# Patient Record
Sex: Male | Born: 1950 | Race: White | Hispanic: No | Smoking: Never smoker
Health system: Southern US, Community
[De-identification: ages and names within clinical notes are randomized; demographics above are authoritative.]

## PROBLEM LIST (undated history)

## (undated) DIAGNOSIS — H9190 Unspecified hearing loss, unspecified ear: Secondary | ICD-10-CM

## (undated) DIAGNOSIS — I10 Essential (primary) hypertension: Secondary | ICD-10-CM

## (undated) HISTORY — PX: APPENDECTOMY: SHX54

## (undated) HISTORY — DX: Essential (primary) hypertension: I10

## (undated) HISTORY — PX: DENTAL SURGERY: SHX609

## (undated) HISTORY — PX: NASAL SEPTUM SURGERY: SHX37

## (undated) HISTORY — DX: Unspecified hearing loss, unspecified ear: H91.90

## (undated) HISTORY — PX: CATARACT EXTRACTION, BILATERAL: SHX1313

---

## 1999-07-28 ENCOUNTER — Ambulatory Visit (HOSPITAL_COMMUNITY): Admission: RE | Admit: 1999-07-28 | Discharge: 1999-07-28 | Payer: Self-pay | Admitting: Otolaryngology

## 1999-07-28 ENCOUNTER — Encounter: Payer: Self-pay | Admitting: Otolaryngology

## 2007-09-03 HISTORY — PX: COLONOSCOPY: SHX174

## 2008-04-29 ENCOUNTER — Ambulatory Visit: Payer: Self-pay | Admitting: Internal Medicine

## 2008-05-13 ENCOUNTER — Ambulatory Visit: Payer: Self-pay | Admitting: Internal Medicine

## 2010-06-11 ENCOUNTER — Ambulatory Visit: Payer: Self-pay | Admitting: Cardiovascular Disease

## 2010-06-13 ENCOUNTER — Ambulatory Visit: Payer: Self-pay | Admitting: Cardiovascular Disease

## 2010-06-13 ENCOUNTER — Encounter: Payer: Self-pay | Admitting: Cardiovascular Disease

## 2010-06-13 ENCOUNTER — Ambulatory Visit: Payer: Self-pay

## 2010-06-13 ENCOUNTER — Ambulatory Visit (HOSPITAL_COMMUNITY): Admission: RE | Admit: 2010-06-13 | Discharge: 2010-06-13 | Payer: Self-pay | Admitting: Cardiovascular Disease

## 2010-09-20 ENCOUNTER — Ambulatory Visit: Payer: Self-pay | Admitting: Cardiovascular Disease

## 2017-07-23 ENCOUNTER — Encounter: Payer: Self-pay | Admitting: Neurology

## 2017-10-08 ENCOUNTER — Ambulatory Visit: Payer: 59 | Admitting: Neurology

## 2017-10-08 ENCOUNTER — Encounter: Payer: Self-pay | Admitting: Neurology

## 2017-10-08 ENCOUNTER — Other Ambulatory Visit: Payer: 59

## 2017-10-08 VITALS — BP 166/94 | HR 86 | Ht 73.0 in | Wt 181.0 lb

## 2017-10-08 DIAGNOSIS — R413 Other amnesia: Secondary | ICD-10-CM | POA: Diagnosis not present

## 2017-10-08 NOTE — Progress Notes (Signed)
NEUROLOGY CONSULTATION NOTE  BRYCETON HANTZ MRN: [id number] DOB: 1951/08/11  Referring provider: Dr. Domenick Gong Primary care provider: Dr. Domenick Gong  Reason for consult:  Short term memory loss  Dear Dr Osborne Casco:  Thank you for your kind referral of Seth Gonzalez for consultation of the above symptoms. Although his history is well known to you, please allow me to reiterate it for the purpose of our medical record. Records and images were personally reviewed where available.   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 He states he knows it has gotten worse, but his friend of 30 years "thinks it's Alzheimer's" because she would say something and he would not remember she told him this. He would forget to call her at a certain set time. He feels some of this may relate to his decreased hearing on the right ear, he does notice he picks up less if she talks on that side. He knows that if he has something to do, he has to do it right then, otherwise he forgets. 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 He has always needed to write down notes for things to do, he is able to complete tasks. He continues to work, and has not noticed memory issues affect work. He denies getting lost driving, no missed bills. He is only on aspirin, and takes vitamin B12 and a multivitamin, and does not forget them. He has occasional word-finding difficulties. His paternal uncle had Alzheimer's disease, a maternal aunt had dementia. He denies any history of significant head injuries, no alcohol use.   He has had numbness in his toes and feet that have progressively worsened the past several years. He occasionally feels like his right foot catches. His fingers are occasionally affected. He denies any  significant neck/back pain. He denies any headaches, dizziness, diplopia, dysarthria/dysphagia, bowel/bladder dysfunction, tremors. No recent falls. His sense of smell is decreased.    PAST MEDICAL HISTORY: Past Medical History:  Diagnosis Date  . Hearing loss   . Hypertension     PAST SURGICAL HISTORY: Past Surgical History:  Procedure Laterality Date  . APPENDECTOMY    . DENTAL SURGERY    . NASAL SEPTUM SURGERY      MEDICATIONS: Current Outpatient Medications on File Prior to Visit  Medication Sig Dispense Refill  . aspirin EC 81 MG tablet Take 81 mg by mouth daily.    . Multiple Vitamin (MULTIVITAMIN) tablet Take 1 tablet by mouth daily.    . sildenafil (REVATIO) 20 MG tablet TK 5 TS PO PRF ERECTILE DYSFUNCTION  5   No current facility-administered medications on file prior to visit.     ALLERGIES: No Known Allergies  FAMILY HISTORY: Family History  Problem Relation Age of Onset  . Dementia Maternal Aunt   . Dementia Paternal Uncle     SOCIAL HISTORY: Social History   Socioeconomic History  . Marital status: Divorced    Spouse name: Not on file  . Number of children: Not on file  . Years of education: Not on file  . Highest education level: Not on file  Social Needs  . Financial resource strain: Not on file  . Food insecurity - worry: Not on file  . Food insecurity - inability: Not on file  . Transportation needs - medical:  Not on file  . Transportation needs - non-medical: Not on file  Occupational History  . Not on file  Tobacco Use  . Smoking status: Never Smoker  . Smokeless tobacco: Never Used  Substance and Sexual Activity  . Alcohol use: No    Frequency: Never  . Drug use: No  . Sexual activity: Not on file  Other Topics Concern  . Not on file  Social History Narrative   Pt lives alone in 2 story home   Divorced   No children   Has associates degree   Works as Land.     REVIEW OF SYSTEMS: Constitutional: No fevers,  chills, or sweats, no generalized fatigue, change in appetite Eyes: No visual changes, double vision, eye pain Ear, nose and throat: No hearing loss, ear pain, nasal congestion, sore throat Cardiovascular: No chest pain, palpitations Respiratory:  No shortness of breath at rest or with exertion, wheezes GastrointestinaI: No nausea, vomiting, diarrhea, abdominal pain, fecal incontinence Genitourinary:  No dysuria, urinary retention or frequency Musculoskeletal:  No neck pain, back pain Integumentary: No rash, pruritus, skin lesions Neurological: as above Psychiatric: No depression, insomnia, anxiety Endocrine: No palpitations, fatigue, diaphoresis, mood swings, change in appetite, change in weight, increased thirst Hematologic/Lymphatic:  No anemia, purpura, petechiae. Allergic/Immunologic: no itchy/runny eyes, nasal congestion, recent allergic reactions, rashes  PHYSICAL EXAM: Vitals:   10/08/17 0850  BP: (!) 166/94  Pulse: 86  SpO2: 96%   General: No acute distress Head:  Normocephalic/atraumatic Eyes: Fundoscopic exam shows bilateral sharp discs, no vessel changes, exudates, or hemorrhages Neck: supple, no paraspinal tenderness, full range of motion Back: No paraspinal tenderness Heart: regular rate and rhythm Lungs: Clear to auscultation bilaterally. Vascular: No carotid bruits. Skin/Extremities: No rash, no edema Neurological Exam: Mental status: alert and oriented to person, place, and time, no dysarthria or aphasia, Fund of knowledge is appropriate.  Recent and remote memory are intact.  Attention and concentration are normal.    Able to name objects and repeat phrases.  Montreal Cognitive Assessment  10/08/2017  Visuospatial/ Executive (0/5) 5  Naming (0/3) 3  Attention: Read list of digits (0/2) 2  Attention: Read list of letters (0/1) 1  Attention: Serial 7 subtraction starting at 100 (0/3) 3  Language: Repeat phrase (0/2) 2  Language : Fluency (0/1) 1  Abstraction  (0/2) 2  Delayed Recall (0/5) 4  Orientation (0/6) 6  Total 29   Cranial nerves: CN I: not tested CN II: pupils equal, round and reactive to light, visual fields intact, fundi unremarkable. CN III, IV, VI:  full range of motion, no nystagmus, no ptosis CN V: facial sensation intact CN VII: upper and lower face symmetric CN VIII: hearing intact to finger rub CN IX, X: gag intact, uvula midline CN XI: sternocleidomastoid and trapezius muscles intact CN XII: tongue midline Bulk & Tone: normal, no fasciculations. Motor: 5/5 throughout with no pronator drift. Sensation: intact to light touch, cold, pin on both UE, decreased cold on both LE, tingling to pin on both feet, decreased vibration to left ankle. No extinction to double simultaneous stimulation.  Romberg test negative Deep Tendon Reflexes: brisk +2 on both UE and patella, +1 bilateral ankle jerks, no ankle clonus, negative Hoffman sign Plantar responses: downgoing bilaterally Cerebellar: no incoordination on finger to nose, heel to shin. No dysdiadochokinesia Gait: narrow-based and steady, able to tandem walk adequately. Tremor: none  IMPRESSION: This is a pleasant 67 year old right-handed man with a history of diet-controlled hypertension,  hearing loss on right ear, presenting for worsening short-term memory. His neurological exam is non-focal, there is note of decreased sensation in both feet, reflexes are quite brisk. MOCA score normal 29/30. We discussed different causes of memory loss. Check TSH and B12. MRI brain without contrast will be ordered to assess for underlying structural abnormality and assess vascular load. There is no indication to start cholinesterase inhibitors at this time. He mentions possible attention deficit disorder, we discussed doing Neurocognitive testing to further evaluate cognitive complaints, he would like to hold off for now. We discussed the importance of control of vascular risk factors, physical  exercise, and brain stimulation exercises for brain health. He will follow-up in 6 months and knows to call for any changes.   Thank you for allowing me to participate in the care of this patient. Please do not hesitate to call for any questions or concerns.   Ellouise Newer, M.D.  CC: [name]

## 2017-10-08 NOTE — Patient Instructions (Addendum)
1. Schedule MRI brain without contrast  We have sent a referral to [facility] for your MRI and they will call you directly to schedule your appt. They are located at [place]. If you need to contact them directly please call [phone].   2. Bloodwork for TSH, B12  Your provider has requested that you have labwork completed today. Please go to [facility] ([id number]) on the second floor of this building before leaving the office today. You do not need to check in. If you are not called within 15 minutes please check with the front desk.   3. Follow-up in 6 months, call for any changes  RECOMMENDATIONS FOR ALL PATIENTS WITH MEMORY PROBLEMS: 1. Continue to exercise (Recommend 30 minutes of walking everyday, or 3 hours every week) 2. Increase social interactions - continue going to [place] and enjoy social gatherings with friends and family 3. Eat healthy, avoid fried foods and eat more fruits and vegetables 4. Maintain adequate blood pressure, blood sugar, and blood cholesterol level. Reducing the risk of stroke and cardiovascular disease also helps promoting better memory. 5. Avoid stressful situations. Live a simple life and avoid aggravations. Organize your time and prepare for the next day in anticipation. 6. Sleep well, avoid any interruptions of sleep and avoid any distractions in the bedroom that may interfere with adequate sleep quality 7. Avoid sugar, avoid sweets as there is a strong link between excessive sugar intake, diabetes, and cognitive impairment We discussed the Mediterranean diet, which has been shown to help patients reduce the risk of progressive memory disorders and reduces cardiovascular risk. This includes eating fish, eat fruits and green leafy vegetables, nuts like almonds and hazelnuts, walnuts, and also use olive oil. Avoid fast foods and fried foods as much as possible. Avoid sweets and sugar as sugar use has been linked to worsening of  memory function.

## 2017-10-09 LAB — TSH: TSH: 2.94 m[IU]/L (ref 0.40–4.50)

## 2017-10-09 LAB — VITAMIN B12: VITAMIN B 12: 1061 pg/mL (ref 200–1100)

## 2017-10-16 ENCOUNTER — Telehealth: Payer: Self-pay

## 2017-10-16 NOTE — Telephone Encounter (Signed)
LMOM relaying message below.  

## 2017-10-16 NOTE — Telephone Encounter (Signed)
-----   Message from Cameron Sprang, MD sent at 10/10/2017 12:40 PM EST ----- Pls let him know thyroid and B12 levels are normal. Thanks

## 2017-10-29 ENCOUNTER — Other Ambulatory Visit: Payer: 59

## 2017-10-30 ENCOUNTER — Ambulatory Visit
Admission: RE | Admit: 2017-10-30 | Discharge: 2017-10-30 | Disposition: A | Discharge status: Home / Self Care | Payer: 59 | Source: Ambulatory Visit | Attending: Neurology | Admitting: Neurology

## 2017-10-30 DIAGNOSIS — R413 Other amnesia: Secondary | ICD-10-CM

## 2017-11-06 ENCOUNTER — Telehealth: Payer: Self-pay

## 2017-11-06 NOTE — Telephone Encounter (Signed)
LMOM relaying message below.  

## 2017-11-06 NOTE — Telephone Encounter (Signed)
-----   Message from Cameron Sprang, MD sent at 11/03/2017 12:00 PM EST ----- Pls let him know MRI brain did not show any evidence of tumor, stroke, or bleed. It showed age-related changes. Thanks

## 2017-11-17 ENCOUNTER — Telehealth: Payer: Self-pay | Admitting: Neurology

## 2017-11-17 NOTE — Telephone Encounter (Signed)
Patient wants to be referred to somewhere for he to be tested for dyslexia

## 2017-11-18 ENCOUNTER — Other Ambulatory Visit: Payer: Self-pay

## 2017-11-18 DIAGNOSIS — R4789 Other speech disturbances: Secondary | ICD-10-CM

## 2017-11-18 NOTE — Telephone Encounter (Signed)
Speech therapy can do this, I think. If he agrees, pls send referral to speech therapy for word-finding difficulties, patient concerned about dyslexia. Thanks

## 2017-11-18 NOTE — Telephone Encounter (Signed)
Orders placed.  Spoke with pt making him aware of referral.

## 2017-11-18 NOTE — Telephone Encounter (Signed)
Will send - just not sure where to

## 2018-01-15 ENCOUNTER — Ambulatory Visit: Payer: 59 | Attending: Neurology | Admitting: Speech Pathology

## 2018-01-15 ENCOUNTER — Other Ambulatory Visit: Payer: Self-pay

## 2018-01-15 DIAGNOSIS — R4701 Aphasia: Secondary | ICD-10-CM | POA: Diagnosis present

## 2018-01-15 DIAGNOSIS — R41841 Cognitive communication deficit: Secondary | ICD-10-CM | POA: Diagnosis present

## 2018-01-15 NOTE — Therapy (Signed)
[facility] [address] [place], [place], [address] Phone: [phone]   Fax:  [phone]  Speech Language Pathology Evaluation  Patient Details  Name: Seth Gonzalez MRN: [id number] Date of Birth: August 26, 1951 Referring Provider: Dr. Delice Lesch   Encounter Date: 01/15/2018  End of Session - 01/15/18 1042    Visit Number  1    Number of Visits  1    Date for SLP Re-Evaluation  01/15/18    SLP Start Time  0850    SLP Stop Time   0930    SLP Time Calculation (min)  40 min    Activity Tolerance  Patient tolerated treatment well       Past Medical History:  Diagnosis Date  . Hearing loss   . Hypertension     Past Surgical History:  Procedure Laterality Date  . APPENDECTOMY    . DENTAL SURGERY    . NASAL SEPTUM SURGERY      There were no vitals filed for this visit.  Subjective Assessment - 01/15/18 0853    Subjective  "She's on a kick that I have dyslexia" (pt reports friend has been concerned he has trouble with words)    Currently in Pain?  No/denies         SLP Evaluation [facility] - 01/15/18 0071      SLP Visit Information   SLP Received On  01/15/18    Referring Provider  Dr. Delice Lesch    Onset Date  11/18/17    Medical Diagnosis  word-finding difficulties      General Information   HPI  67 year old right-handed man with a history of diet-controlled hypertension, hearing loss of right ear, referred by Dr. Delice Lesch for worsening short-term memory. Per MD notes neurological exam is non-focal, MOCA score normal 29/30. MRI 10/30/17: "No acute intracranial abnormality. Normal cerebral volume. Scattered cerebral white matter signal changes are nonspecific and only mildly advanced for age."    Behavioral/Cognition  alert, cooperative    Mobility Status  ambulated to session      Balance Screen   Has the patient fallen in the past 6 months  No    Has the patient had a decrease in activity level because of a fear of  falling?   No    Is the patient reluctant to leave their home because of a fear of falling?   No      Prior Functional Status   Cognitive/Linguistic Baseline  Within functional limits    Type of Home  House      Cognition   Overall Cognitive Status  Within Functional Limits for tasks assessed    Attention  Focused;Sustained;Selective;Alternating    Focused Attention  Appears intact    Sustained Attention  Appears intact    Selective Attention  Appears intact    Alternating Attention  Appears intact    Memory  Appears intact    Awareness  Appears intact    Problem Solving  Appears intact    Executive Function  Reasoning;Sequencing;Organizing;Decision Making;Self Monitoring;Initiating;Self Correcting    Reasoning  Appears intact    Sequencing  Appears intact    Organizing  Appears intact    Decision Making  Appears intact    Initiating  Appears intact    Self Monitoring  Appears intact    Self Correcting  Appears intact      Auditory Comprehension   Overall Auditory Comprehension  Appears within functional limits for tasks assessed  Yes/No Questions  Within Functional Limits    Commands  Within Functional Limits    Conversation  Complex    Interfering Components  Hearing      Visual Recognition/Discrimination   Discrimination  Within Function Limits      Reading Comprehension   Reading Status  Not tested      Verbal Expression   Overall Verbal Expression  Appears within functional limits for tasks assessed    Initiation  No impairment    Automatic Speech  Name;Social Response    Level of Generative/Spontaneous Verbalization  Conversation    Repetition  No impairment    Naming  No impairment    Pragmatics  No impairment      Written Expression   Dominant Hand  Right    Written Expression  Not tested      Oral Motor/Sensory Function   Overall Oral Motor/Sensory Function  Appears within functional limits for tasks assessed      Motor Speech   Overall Motor Speech   Appears within functional limits for tasks assessed      Standardized Assessments   Standardized Assessments   Cognitive Linguistic Quick Test;[place] Naming Test-2nd edition    Boston Naming Test-2nd edition   54/60 WNL for age 75-69      Cognitive Linguistic Quick Test (Ages 44-69)   Attention  WNL    Memory  WNL    Executive Function  WNL    Language  WNL    Visuospatial Skills  WNL    Severity Rating Total  20    Composite Severity Rating  16.8                      SLP Education - 01/15/18 1042    Education provided  Yes    Education Details  strategies for attention/memory, cognitive/language activities for home    Person(s) Educated  Patient    Methods  Explanation;Handout    Comprehension  Verbalized understanding           Plan - 01/15/18 1059    Clinical Impression Statement  Seth Gonzalez presents today with cognitive-linguistic skills appearing within normal limits. He reports his close friend of 39 years is concerned he is "dyslexic"; with further questioning pt provides examples including verbally mixing up names (eg. [employer] vs MacGyver, Bush vs Trump) or occasionally mispronouncing names ([place]y vs [place]), and states that his friend feels he "doesn't process a whole sentence." Of note, pt does have hearing loss in his right ear, and reports he has difficulty hearing higher pitched consonant sounds. He states he occasionally has to request repetition in noisier environments. 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 He does report "being more forgetful"; he is using compensatory strategies including writing notes to help with functional recall. No wordfinding difficulties are noted in mod complex-complex conversation. Pt scored 54/60 on BNT, within normal limits (age 59-69 mean 53.3, SD 4.6). Administered CLQT, pt scored in higher range of WNL in attention, memory, executive functions, language,  visuospatial skills and clock drawing. Provided education re: evaluation findings and educated re: communication tips and memory/attention strategies verbally and in handout form. No further skilled ST needs identified at this time; pt encouraged to follow-up with MD and return for re-evaluation if he notes decline in communicative or cognitive function.    Speech Therapy Frequency  One time visit    Treatment/Interventions  Patient/family education  Potential to Achieve Goals  Good    Consulted and Agree with Plan of Care  Patient       Patient will benefit from skilled therapeutic intervention in order to improve the following deficits and impairments:   Cognitive communication deficit  Aphasia    Problem List There are no active problems to display for this patient.  Deneise Lever, [place], CCC-SLP Speech-Language Pathologist  Aliene Altes 01/15/2018, 11:01 AM  [facility] [address] [place], [place], [address] Phone: [phone]   Fax:  [phone]  Name: Seth Gonzalez MRN: [id number] Date of Birth: December 27, 1950

## 2018-01-15 NOTE — Patient Instructions (Signed)
  Cognitive Activities you can do at home:   - Hurley (easy level)  - [place]ola  On your computer, tablet or phone: Furniture conservator/restorer Therapy (free cognitive and language activities for iPhone/iPad) [email or web address] Neuronation App [employer] Game App [employer] IQ Logic [employer] Sort it out (easy) Photo Quiz  - what's the word Mix 2 Words Spot the difference games    Tips for Talking  . Say one thing at a time . Don't  rush - slow down, be patient . Talk face to face . Reduce background noise . Relax - be natural  Describing words  What group does it belong to?  What do I use it for?  Where can I find it?  What does it LOOK like?  What other words go with it?  What is the 1st sound of the word?   Many Ways to Communicate  Describe it Write it Draw it Gesture it Use related words  Strategies for Improving Your Attention and Memory  Use good eye-contact  Give the speaker your undivided attention  Look directly at the speaker  Complete one task at a time  Avoid multitasking  Complete one task before starting a new one  Write a note to yourself if you think of something else that needs to be done  Let others know when you need quiet time and can't be interrupted  Don't answer the phone, texts, or emails while you are working on another task  Put aside distracting thoughts  If you find your mind wandering, refocus your attention on the speaker  Avoid off-topic comments or responses that may divert your attention   If something important comes to mind, let the speaker know and pause to write yourself a note: "Do you mind holding on a minute, I have to write something down."  Put thoughts on hold and focus on salient information  Limit distractions in your  environment  Think about the environment around you  Limit background noise by turning off the TV or music, putting your phone away  Close the door and work in quiet  Use active listening  Actively participate in the conversation to stay focused  Paraphrase what you have heard to include the most important details  Adding some associations may help you remember  Ask questions to clarify certain points  Summarize the speaker's comments periodically  Avoid nodding your head and using "mhm" responses as these are more passive and don't help your attention  Alert the other person/people  It may be helpful to alert your listener to the fact that you may need reminders to keep on track  Tell the speaker in advance that you may need to stop them and have them repeat salient information  If you lose focus, interject and let the person know, "I'm sorry, I lost you, can you tell me again?"  Write down information  Write down pertinent information as it comes up, such as telephone numbers, names of people, addresses, details from appointments and conversations, etc.

## 2018-04-15 ENCOUNTER — Encounter: Payer: Self-pay | Admitting: Neurology

## 2018-04-15 ENCOUNTER — Ambulatory Visit: Payer: 59 | Admitting: Neurology

## 2018-04-15 ENCOUNTER — Other Ambulatory Visit: Payer: Self-pay

## 2018-04-15 VITALS — BP 136/84 | HR 65 | Ht 73.0 in | Wt 176.0 lb

## 2018-04-15 DIAGNOSIS — R413 Other amnesia: Secondary | ICD-10-CM | POA: Diagnosis not present

## 2018-04-15 NOTE — Progress Notes (Signed)
NEUROLOGY FOLLOW UP OFFICE NOTE  TUKKER BYRNS [id number] [birth year]  HISTORY OF PRESENT ILLNESS: I had the pleasure of seeing Starling Christofferson in follow-up in the neurology clinic on 04/15/2018.  The patient was last seen 6 months ago for short-term memory loss. He is alone in the office today.  Records and images were personally reviewed where available.  TSH and B12 normal. I personally reviewed MRI brain without contrast which did not show any acute changes. There was mild diffuse atrophy and mild to moderate chronic microvascular disease. He has not noticed any changes in his memory. His friend has not seen any difference but has not mentioned of significant worsening. His friend wondered about dyslexia, because he would sometimes "misconstrue" what he said. Sometimes he mixes up the last 2 numbers when writing things down, but is aware of it. He denies getting lost driving, no missed bills. He only takes aspirin. He denies any headaches, dizziness, vision changes, no falls. He has numbness in both feet.   History on Initial Assessment 10/08/2017: This is a pleasant 67 year old right-handed [place] with diet-controlled hypertension, right ear hearing loss, presenting for evaluation of short-term memory loss. He states he knows it has gotten worse, but his friend of 30 years "thinks it's Alzheimer's" because she would say something and he would not remember she told him this. He would forget to call her at a certain set time. He feels some of this may relate to his decreased hearing on the right ear, he does notice he picks up less if she talks on that side. He knows that if he has something to do, he has to do it right then, otherwise he forgets. 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 He has always needed to write down notes for things to do, he is able to complete tasks. He continues to work, and has not noticed memory issues affect  work. He denies getting lost driving, no missed bills. He is only on aspirin, and takes vitamin B12 and a multivitamin, and does not forget them. He has occasional word-finding difficulties. His paternal uncle had Alzheimer's disease, a maternal aunt had dementia. He denies any history of significant head injuries, no alcohol use.   He has had numbness in his toes and feet that have progressively worsened the past several years. He occasionally feels like his right foot catches. His fingers are occasionally affected. He denies any significant neck/back pain. He denies any headaches, dizziness, diplopia, dysarthria/dysphagia, bowel/bladder dysfunction, tremors. No recent falls. His sense of smell is decreased.    PAST MEDICAL HISTORY: Past Medical History:  Diagnosis Date  . Hearing loss   . Hypertension     MEDICATIONS: Current Outpatient Medications on File Prior to Visit  Medication Sig Dispense Refill  . aspirin EC 81 MG tablet Take 81 mg by mouth daily.    . Multiple Vitamin (MULTIVITAMIN) tablet Take 1 tablet by mouth daily.    . sildenafil (REVATIO) 20 MG tablet TK 5 TS PO PRF ERECTILE DYSFUNCTION  5   No current facility-administered medications on file prior to visit.     ALLERGIES: No Known Allergies  FAMILY HISTORY: Family History  Problem Relation Age of Onset  . Dementia Maternal Aunt   . Dementia Paternal Uncle     SOCIAL HISTORY: Social History   Socioeconomic History  . Marital status: Divorced    Spouse name: Not on  file  . Number of children: Not on file  . Years of education: Not on file  . Highest education level: Not on file  Occupational History  . Not on file  Social Needs  . Financial resource strain: Not on file  . Food insecurity:    Worry: Not on file    Inability: Not on file  . Transportation needs:    Medical: Not on file    Non-medical: Not on file  Tobacco Use  . Smoking status: Never Smoker  . Smokeless tobacco: Never Used    Substance and Sexual Activity  . Alcohol use: No    Frequency: Never  . Drug use: No  . Sexual activity: Not on file  Lifestyle  . Physical activity:    Days per week: Not on file    Minutes per session: Not on file  . Stress: Not on file  Relationships  . Social connections:    Talks on phone: Not on file    Gets together: Not on file    Attends religious service: Not on file    Active member of club or organization: Not on file    Attends meetings of clubs or organizations: Not on file    Relationship status: Not on file  . Intimate partner violence:    Fear of current or ex partner: Not on file    Emotionally abused: Not on file    Physically abused: Not on file    Forced sexual activity: Not on file  Other Topics Concern  . Not on file  Social History Narrative   Pt lives alone in 2 story home   Divorced   No children   Has associates degree   Works as Land.     REVIEW OF SYSTEMS: Constitutional: No fevers, chills, or sweats, no generalized fatigue, change in appetite Eyes: No visual changes, double vision, eye pain Ear, nose and throat: No hearing loss, ear pain, nasal congestion, sore throat Cardiovascular: No chest pain, palpitations Respiratory:  No shortness of breath at rest or with exertion, wheezes GastrointestinaI: No nausea, vomiting, diarrhea, abdominal pain, fecal incontinence Genitourinary:  No dysuria, urinary retention or frequency Musculoskeletal:  No neck pain, back pain Integumentary: No rash, pruritus, skin lesions Neurological: as above Psychiatric: No depression, insomnia, anxiety Endocrine: No palpitations, fatigue, diaphoresis, mood swings, change in appetite, change in weight, increased thirst Hematologic/Lymphatic:  No anemia, purpura, petechiae. Allergic/Immunologic: no itchy/runny eyes, nasal congestion, recent allergic reactions, rashes  PHYSICAL EXAM: Vitals:   04/15/18 0948  BP: 136/84  Pulse: 65  SpO2: 98%    General: No acute distress Head:  Normocephalic/atraumatic Neck: supple, no paraspinal tenderness, full range of motion Skin/Extremities: No rash, no edema Neurological Exam: alert and oriented to person, place, and time. No aphasia or dysarthria. Fund of knowledge is appropriate.  Recent and remote memory are intact.  Attention and concentration are normal.    Able to name objects and repeat phrases.  Montreal Cognitive Assessment  04/15/2018 10/08/2017  Visuospatial/ Executive (0/5) 5 5  Naming (0/3) 3 3  Attention: Read list of digits (0/2) 2 2  Attention: Read list of letters (0/1) 1 1  Attention: Serial 7 subtraction starting at 100 (0/3) 3 3  Language: Repeat phrase (0/2) 2 2  Language : Fluency (0/1) 1 1  Abstraction (0/2) 2 2  Delayed Recall (0/5) 5 4  Orientation (0/6) 6 6  Total 30 29   Cranial nerves: Pupils equal, round, reactive to light.  Extraocular movements intact with no nystagmus. Visual fields full. Facial sensation intact. No facial asymmetry. Tongue, uvula, palate midline.  Motor: Bulk and tone normal, muscle strength 5/5 throughout with no pronator drift.  Sensation to light touch intact.  No extinction to double simultaneous stimulation.  Deep tendon reflexes +2 throughout, toes downgoing.  Finger to nose testing intact.  Gait narrow-based and steady, able to tandem walk adequately.  Romberg negative.  IMPRESSION: This is a pleasant 67 yo RH man with a history of diet-controlled hypertension, hearing loss on right ear, who presented for worsening short-term memory. His neurological exam is non-focal. MOCA score today normal 30/30. Normal TSH, B12. MRI brain no acute changes. He has not noticed any significant changes in memory. His friend wonders about dyslexia or ADHD, we again discussed doing Neurocognitive testing for further evaluation which he declines today. We again discussed the importance of control of vascular risk factors, physical exercise, and brain  stimulation exercises for brain health. He will follow-up in 1 year and knows to call for any changes  Thank you for allowing me to participate in his care.  Please do not hesitate to call for any questions or concerns.  The duration of this appointment visit was 30 minutes of face-to-face time with the patient.  Greater than 50% of this time was spent in counseling, explanation of diagnosis, planning of further management, and coordination of care.   Ellouise Newer, M.D.   CC: [name]

## 2018-04-15 NOTE — Patient Instructions (Signed)
Looking good! Follow-up in 1 year, call if any changes   RECOMMENDATIONS FOR ALL PATIENTS WITH MEMORY PROBLEMS: 1. Continue to exercise (Recommend 30 minutes of walking everyday, or 3 hours every week) 2. Increase social interactions - continue going to [place] and enjoy social gatherings with friends and family 3. Eat healthy, avoid fried foods and eat more fruits and vegetables 4. Maintain adequate blood pressure, blood sugar, and blood cholesterol level. Reducing the risk of stroke and cardiovascular disease also helps promoting better memory. 5. Avoid stressful situations. Live a simple life and avoid aggravations. Organize your time and prepare for the next day in anticipation. 6. Sleep well, avoid any interruptions of sleep and avoid any distractions in the bedroom that may interfere with adequate sleep quality 7. Avoid sugar, avoid sweets as there is a strong link between excessive sugar intake, diabetes, and cognitive impairment We discussed the Mediterranean diet, which has been shown to help patients reduce the risk of progressive memory disorders and reduces cardiovascular risk. This includes eating fish, eat fruits and green leafy vegetables, nuts like almonds and hazelnuts, walnuts, and also use olive oil. Avoid fast foods and fried foods as much as possible. Avoid sweets and sugar as sugar use has been linked to worsening of memory function.

## 2018-09-07 ENCOUNTER — Encounter: Payer: Self-pay | Admitting: Internal Medicine

## 2019-04-16 ENCOUNTER — Ambulatory Visit: Payer: 59 | Admitting: Neurology

## 2019-09-27 ENCOUNTER — Ambulatory Visit: Payer: Medicare Other | Attending: Internal Medicine

## 2019-09-27 DIAGNOSIS — Z23 Encounter for immunization: Secondary | ICD-10-CM | POA: Insufficient documentation

## 2019-09-27 NOTE — Progress Notes (Signed)
   Covid-19 Vaccination Clinic  Name:  Seth Gonzalez    MRN: [id number] DOB: May 24, 1951  09/27/2019  Mr. Clabough was observed post Covid-19 immunization for 15 minutes without incidence. He was provided with Vaccine Information Sheet and instruction to access the V-Safe system.   Mr. Zakowski was instructed to call 911 with any severe reactions post vaccine: Marland Kitchen Difficulty breathing  . Swelling of your face and throat  . A fast heartbeat  . A bad rash all over your body  . Dizziness and weakness    Immunizations Administered    Name Date Dose VIS Date Route   Pfizer COVID-19 Vaccine 09/27/2019  8:43 AM 0.3 mL 08/13/2019 Intramuscular   Manufacturer: [place]   Lot: [id number]   [place]: [id number]

## 2019-10-18 ENCOUNTER — Ambulatory Visit: Payer: Medicare Other | Attending: Internal Medicine

## 2019-10-18 DIAGNOSIS — Z23 Encounter for immunization: Secondary | ICD-10-CM | POA: Insufficient documentation

## 2019-10-18 NOTE — Progress Notes (Signed)
   Covid-19 Vaccination Clinic  Name:  Seth Gonzalez    MRN: [id number] DOB: Dec 08, 1950  10/18/2019  [name] was observed post Covid-19 immunization for 15 minutes without incidence. He was provided with Vaccine Information Sheet and instruction to access the V-Safe system.   [name] was instructed to call 911 with any severe reactions post vaccine: Marland Kitchen Difficulty breathing  . Swelling of your face and throat  . A fast heartbeat  . A bad rash all over your body  . Dizziness and weakness    Immunizations Administered    Name Date Dose VIS Date Route   Pfizer COVID-19 Vaccine 10/18/2019  9:10 AM 0.3 mL 08/13/2019 Intramuscular   Manufacturer: [place]   Lot: [id number]   [place]: [id number]

## 2019-11-24 ENCOUNTER — Encounter: Payer: Self-pay | Admitting: Internal Medicine

## 2019-12-21 ENCOUNTER — Other Ambulatory Visit: Payer: Self-pay

## 2019-12-21 ENCOUNTER — Ambulatory Visit (AMBULATORY_SURGERY_CENTER): Payer: Self-pay | Admitting: *Deleted

## 2019-12-21 VITALS — Temp 97.1°F | Ht 73.0 in | Wt 185.0 lb

## 2019-12-21 DIAGNOSIS — Z1211 Encounter for screening for malignant neoplasm of colon: Secondary | ICD-10-CM

## 2019-12-21 NOTE — Progress Notes (Signed)
Patient is here in-person for PV. Patient denies any allergies to eggs or soy. Patient denies any problems with anesthesia/sedation. Patient denies any oxygen use at home. Patient denies taking any diet/weight loss medications or blood thinners. Patient is not being treated for MRSA or C-diff. EMMI education assisgned to the patient for the procedure, this was explained and instructions given to patient. Patient is aware of our care-partner policy and 0000000 safety protocol.    Pt completed covid vaccines on 10/18/19.

## 2020-01-04 ENCOUNTER — Encounter: Payer: Medicare Other | Admitting: Internal Medicine

## 2020-01-06 ENCOUNTER — Ambulatory Visit: Payer: Medicare Other | Attending: Internal Medicine

## 2020-01-06 ENCOUNTER — Other Ambulatory Visit: Payer: Self-pay

## 2020-01-06 DIAGNOSIS — Z20822 Contact with and (suspected) exposure to covid-19: Secondary | ICD-10-CM

## 2020-01-07 ENCOUNTER — Encounter: Payer: Medicare Other | Admitting: Internal Medicine

## 2020-01-07 LAB — SARS-COV-2, NAA 2 DAY TAT

## 2020-01-07 LAB — NOVEL CORONAVIRUS, NAA: SARS-CoV-2, NAA: NOT DETECTED

## 2020-02-01 ENCOUNTER — Encounter: Payer: Self-pay | Admitting: Internal Medicine

## 2020-02-01 ENCOUNTER — Ambulatory Visit (AMBULATORY_SURGERY_CENTER): Payer: Medicare Other | Admitting: Internal Medicine

## 2020-02-01 ENCOUNTER — Other Ambulatory Visit: Payer: Self-pay

## 2020-02-01 VITALS — BP 157/85 | HR 55 | Temp 97.3°F | Resp 15 | Ht 73.0 in | Wt 185.0 lb

## 2020-02-01 DIAGNOSIS — Z1211 Encounter for screening for malignant neoplasm of colon: Secondary | ICD-10-CM | POA: Diagnosis not present

## 2020-02-01 MED ORDER — SODIUM CHLORIDE 0.9 % IV SOLN: 500.0000 mL | Freq: Once | INTRAVENOUS | Status: DC

## 2020-02-01 NOTE — Patient Instructions (Addendum)
No polyps or cancer seen today. You do have diverticulosis - thickened muscle rings and pouches in the colon wall. Please read the handout about this condition. Hemorrhoids were also swollen today (common after the preparation and diarrhea).   I appreciate the opportunity to care for you. Gatha Mayer, MD, FACG      YOU HAD AN ENDOSCOPIC PROCEDURE TODAY AT [facility]:   Refer to the procedure report that was given to you for any specific questions about what was found during the examination.  If the procedure report does not answer your questions, please call your gastroenterologist to clarify.  If you requested that your care partner not be given the details of your procedure findings, then the procedure report has been included in a sealed envelope for you to review at your convenience later.  YOU SHOULD EXPECT: Some feelings of bloating in the abdomen. Passage of more gas than usual.  Walking can help get rid of the air that was put into your GI tract during the procedure and reduce the bloating. If you had a lower endoscopy (such as a colonoscopy or flexible sigmoidoscopy) you may notice spotting of blood in your stool or on the toilet paper. If you underwent a bowel prep for your procedure, you may not have a normal bowel movement for a few days.  Please Note:  You might notice some irritation and congestion in your nose or some drainage.  This is from the oxygen used during your procedure.  There is no need for concern and it should clear up in a day or so.  SYMPTOMS TO REPORT IMMEDIATELY:   Following lower endoscopy (colonoscopy or flexible sigmoidoscopy):  Excessive amounts of blood in the stool  Significant tenderness or worsening of abdominal pains  Swelling of the abdomen that is new, acute  Fever of 100F or higher   For urgent or emergent issues, a gastroenterologist can be reached at any hour by calling [phone]. Do not use MyChart messaging for  urgent concerns.    DIET:  We do recommend a small meal at first, but then you may proceed to your regular diet.  Drink plenty of fluids but you should avoid alcoholic beverages for 24 hours.  ACTIVITY:  You should plan to take it easy for the rest of today and you should NOT DRIVE or use heavy machinery until tomorrow (because of the sedation medicines used during the test).    FOLLOW UP: Our staff will call the number listed on your records 48-72 hours following your procedure to check on you and address any questions or concerns that you may have regarding the information given to you following your procedure. If we do not reach you, we will leave a message.  We will attempt to reach you two times.  During this call, we will ask if you have developed any symptoms of COVID 19. If you develop any symptoms (ie: fever, flu-like symptoms, shortness of breath, cough etc.) before then, please call [phone].  If you test positive for Covid 19 in the 2 weeks post procedure, please call and report this information to [place].    If any biopsies were taken you will be contacted by phone or by letter within the next 1-3 weeks.  Please call us at [phone] if you have not heard about the biopsies in 3 weeks.    SIGNATURES/CONFIDENTIALITY: You and/or your care partner have signed paperwork which will be entered into your electronic medical  record.  These signatures attest to the fact that that the information above on your After Visit Summary has been reviewed and is understood.  Full responsibility of the confidentiality of this discharge information lies with you and/or your care-partner.     Handouts were given to you on diverticulosis and hemorrhoids. You may resume your current medications today. Please call if any questions or concerns.

## 2020-02-01 NOTE — Progress Notes (Signed)
Report given to PACU, vss 

## 2020-02-01 NOTE — Progress Notes (Signed)
Pt's states no medical or surgical changes since previsit or office visit. 

## 2020-02-01 NOTE — Op Note (Signed)
[facility] Patient Name: Seth Gonzalez Procedure Date: 02/01/2020 11:30 AM MRN: [id number] Endoscopist: Gatha Mayer , MD Age: 69 Referring MD:  Date of Birth: 03/31/1951 Gender: Male Account #: [id number] Procedure:                Colonoscopy Indications:              Screening for colorectal malignant neoplasm Medicines:                Propofol per Anesthesia, Monitored Anesthesia Care Procedure:                Pre-Anesthesia Assessment:                           - Prior to the procedure, a History and Physical                            was performed, and patient medications and                            allergies were reviewed. The patient's tolerance of                            previous anesthesia was also reviewed. The risks                            and benefits of the procedure and the sedation                            options and risks were discussed with the patient.                            All questions were answered, and informed consent                            was obtained. Prior Anticoagulants: The patient has                            taken no previous anticoagulant or antiplatelet                            agents. ASA Grade Assessment: I - A normal, healthy                            patient. After reviewing the risks and benefits,                            the patient was deemed in satisfactory condition to                            undergo the procedure.                           After obtaining informed consent, the colonoscope  was passed under direct vision. Throughout the                            procedure, the patient's blood pressure, pulse, and                            oxygen saturations were monitored continuously. The                            Colonoscope was introduced through the anus and                            advanced to the the cecum, identified by                            appendiceal orifice  and ileocecal valve. The                            ileocecal valve, appendiceal orifice, and rectum                            were photographed. The quality of the bowel                            preparation was good. The bowel preparation used                            was Miralax via split dose instruction. Scope In: 11:37:56 AM Scope Out: 11:50:44 AM Scope Withdrawal Time: 0 hours 10 minutes 9 seconds  Total Procedure Duration: 0 hours 12 minutes 48 seconds  Findings:                 The perianal and digital rectal examinations were                            normal.                           Many diverticula were found in the sigmoid colon,                            descending colon and transverse colon.                           Internal hemorrhoids were found.                           The exam was otherwise without abnormality on                            direct and retroflexion views. Complications:            No immediate complications. Estimated blood loss:                            None. Estimated Blood Loss:  Estimated blood loss: none. Impression:               - Severe diverticulosis in the sigmoid colon, in                            the descending colon and in the transverse colon.                           - Internal hemorrhoids.                           - The examination was otherwise normal on direct                            and retroflexion views.                           - No specimens collected. Recommendation:           - Resume previous diet.                           - Continue present medications.                           - No repeat colonoscopy due to current age (14                            years or older) and the absence of colonic polyps. Gatha Mayer, MD 02/01/2020 11:59:06 AM This report has been signed electronically.

## 2020-02-03 ENCOUNTER — Telehealth: Payer: Self-pay | Admitting: *Deleted

## 2020-02-03 ENCOUNTER — Telehealth: Payer: Self-pay

## 2020-02-03 NOTE — Telephone Encounter (Signed)
  Follow up Call-  Call back number 02/01/2020  Post procedure Call Back phone  # [phone]  Permission to leave phone message Yes  Some recent data might be hidden     Patient questions:  Do you have a fever, pain , or abdominal swelling? No. Pain Score  0 *  Have you tolerated food without any problems? Yes.    Have you been able to return to your normal activities? Yes.    Do you have any questions about your discharge instructions: Diet   No. Medications  No. Follow up visit  No.  Do you have questions or concerns about your Care? No.  Actions: * If pain score is 4 or above: No action needed, pain <4.  1. Have you developed a fever since your procedure? no  2.   Have you had an respiratory symptoms (SOB or cough) since your procedure? no  3.   Have you tested positive for COVID 19 since your procedure no  4.   Have you had any family members/close contacts diagnosed with the COVID 19 since your procedure?  no   If yes to any of these questions please route to Joylene John, RN and Erenest Rasher, RN

## 2020-02-03 NOTE — Telephone Encounter (Signed)
Message left

## 2020-11-06 DIAGNOSIS — H35373 Puckering of macula, bilateral: Secondary | ICD-10-CM | POA: Diagnosis not present

## 2021-01-10 ENCOUNTER — Ambulatory Visit (HOSPITAL_COMMUNITY)
Admission: RE | Admit: 2021-01-10 | Discharge: 2021-01-10 | Disposition: A | Discharge status: Home / Self Care | Payer: PPO | Source: Ambulatory Visit | Attending: Internal Medicine | Admitting: Internal Medicine

## 2021-01-10 ENCOUNTER — Other Ambulatory Visit: Payer: Self-pay

## 2021-01-10 ENCOUNTER — Other Ambulatory Visit (HOSPITAL_COMMUNITY): Payer: Self-pay | Admitting: Internal Medicine

## 2021-01-10 DIAGNOSIS — R270 Ataxia, unspecified: Secondary | ICD-10-CM | POA: Insufficient documentation

## 2021-01-10 DIAGNOSIS — I639 Cerebral infarction, unspecified: Secondary | ICD-10-CM | POA: Diagnosis not present

## 2021-01-10 DIAGNOSIS — R531 Weakness: Secondary | ICD-10-CM

## 2021-01-10 DIAGNOSIS — R4701 Aphasia: Secondary | ICD-10-CM | POA: Diagnosis not present

## 2021-01-10 DIAGNOSIS — I10 Essential (primary) hypertension: Secondary | ICD-10-CM | POA: Diagnosis not present

## 2021-01-10 DIAGNOSIS — E7800 Pure hypercholesterolemia, unspecified: Secondary | ICD-10-CM | POA: Diagnosis not present

## 2021-01-10 MED ORDER — GADOBUTROL 1 MMOL/ML IV SOLN
8.0000 mL | Freq: Once | INTRAVENOUS | Status: AC | PRN
  Administered 2021-01-10: 8 mL via INTRAVENOUS

## 2021-01-11 ENCOUNTER — Other Ambulatory Visit (HOSPITAL_COMMUNITY): Payer: Self-pay | Admitting: Internal Medicine

## 2021-01-11 DIAGNOSIS — R4701 Aphasia: Secondary | ICD-10-CM

## 2021-01-11 DIAGNOSIS — R531 Weakness: Secondary | ICD-10-CM

## 2021-01-11 DIAGNOSIS — I639 Cerebral infarction, unspecified: Secondary | ICD-10-CM

## 2021-01-11 DIAGNOSIS — R270 Ataxia, unspecified: Secondary | ICD-10-CM

## 2021-01-16 ENCOUNTER — Encounter: Payer: Self-pay | Admitting: Neurology

## 2021-01-16 ENCOUNTER — Ambulatory Visit: Payer: PPO | Admitting: Neurology

## 2021-01-16 ENCOUNTER — Other Ambulatory Visit: Payer: Self-pay

## 2021-01-16 ENCOUNTER — Other Ambulatory Visit (HOSPITAL_COMMUNITY): Payer: Self-pay | Admitting: Internal Medicine

## 2021-01-16 VITALS — BP 195/95 | HR 71 | Ht 73.0 in | Wt 188.2 lb

## 2021-01-16 DIAGNOSIS — I639 Cerebral infarction, unspecified: Secondary | ICD-10-CM

## 2021-01-16 DIAGNOSIS — R531 Weakness: Secondary | ICD-10-CM

## 2021-01-16 MED ORDER — CLOPIDOGREL BISULFATE 75 MG PO TABS: 1.0000 | ORAL_TABLET | Freq: Every day | ORAL | 3 refills | Status: DC

## 2021-01-16 NOTE — Progress Notes (Signed)
Chief Complaint  Patient presents with  . New Patient (Initial Visit)    He is here for evaluation of CVA. Abnormal MRI brain 01/21/2021.       ASSESSMENT AND PLAN  Seth Gonzalez is a 70 y.o. male   Acute stroke on Jan 09, 2021  Small vessel disease  Vascular risk factor of aging, hypertension, hyperlipidemia  Stop aspirin, single agent with Plavix 75 mg along  Echocardiogram, ultrasound of carotid artery are pending  Also encouraging moderate exercise, increase water intake,  Will inform him of ultrasound of carotid artery report, continue to work with his primary care to address vascular risk factor   DIAGNOSTIC DATA (LABS, IMAGING, TESTING) - I reviewed patient records, labs, notes, testing and imaging myself where available.  Laboratory evaluation 2022, normal serum, A1c was 5.5, TSH four-point, CBC, hemoglobin of 16 CMP creatinine of 0.9  MRI brain w/wo on Jan 10 2021 1. Acute/subacute infarct in the left lenticulocapsular region. 2. Moderate chronic microvascular ischemic changes of the white matter, significantly progressed from prior MRI.   HISTORICAL  Seth Gonzalez is a 70 years old male, seen in request by his primary care physician [name], Richard for evaluation of stroke, initial evaluation was on Jan 16, 2021, he was alone at visit  I reviewed and summarized the referring note. PMHX. HTN HLD.  He is a retired Pharmacist, community, lives alone, exercise regularly, also do light weight training regularly, on Jan 09, 2021, he woke up and noticed mild weakness at the right side, when he walks, he leaning towards his right side, bumped things on his right side, when he uses his right hand, he noticed some clumsiness  He was seen by the primary care physician, had MRI of the brain, we personally reviewed the film on Jan 10, 2021, acute stroke involving left lenticular capsular region, moderate supratentorium small vessel disease  He was on aspirin, not added on  Plavix 75 mg,  Laboratory evaluation showed A1c of 5.5, mild elevated blood pressure,  PHYSICAL EXAM:   Vitals:   01/16/21 1012  BP: (!) 195/95  Pulse: 71  Weight: 188 lb 3.2 oz (85.4 kg)  Height: 6\' 1"  (1.854 m)   Not recorded     Body mass index is 24.83 kg/m.  PHYSICAL EXAMNIATION:  Gen: NAD, conversant, well nourised, well groomed                     Cardiovascular: Regular rate rhythm, no peripheral edema, warm, nontender. Eyes: Conjunctivae clear without exudates or hemorrhage Neck: Supple, no carotid bruits. Pulmonary: Clear to auscultation bilaterally   NEUROLOGICAL EXAM:  MENTAL STATUS: Speech:    Speech is normal; fluent and spontaneous with normal comprehension.  Cognition:     Orientation to time, place and person     Normal recent and remote memory     Normal Attention span and concentration     Normal Language, naming, repeating,spontaneous speech     Fund of knowledge   CRANIAL NERVES: CN II: Visual fields are full to confrontation. Pupils are round equal and briskly reactive to light. CN III, IV, VI: extraocular movement are normal. No ptosis. CN V: Facial sensation is intact to light touch CN VII: Face is symmetric with normal eye closure  CN VIII: Hearing is normal to causal conversation. CN IX, X: Phonation is normal. CN XI: Head turning and shoulder shrug are intact  MOTOR: Mild fixation of right arm on rapid rotating  movement, slight right leg drift  REFLEXES: Hyperreflexia of the right upper and lower extremity  SENSORY: Intact to light touch, pinprick and vibratory sensation are intact in fingers and toes.  COORDINATION: There is no trunk or limb dysmetria noted.  GAIT/STANCE: He can get up from seated position arm crossed, decreased right arm swing, slight dragging right leg  REVIEW OF SYSTEMS:  Full 14 system review of systems performed and notable only for as above All other review of systems were  negative.   ALLERGIES: No Known Allergies  HOME MEDICATIONS: Current Outpatient Medications  Medication Sig Dispense Refill  . aspirin EC 81 MG tablet Take 81 mg by mouth daily.    . clopidogrel (PLAVIX) 75 MG tablet Take 1 tablet by mouth daily.    . Multiple Vitamin (MULTIVITAMIN) tablet Take 1 tablet by mouth daily.    . rosuvastatin (CRESTOR) 20 MG tablet Take 20 mg by mouth daily.    . sildenafil (REVATIO) 20 MG tablet TK 5 TS PO PRF ERECTILE DYSFUNCTION  5  . telmisartan (MICARDIS) 40 MG tablet Take 40 mg by mouth daily.     No current facility-administered medications for this visit.    PAST MEDICAL HISTORY: Past Medical History:  Diagnosis Date  . Hearing loss   . Hypertension     PAST SURGICAL HISTORY: Past Surgical History:  Procedure Laterality Date  . APPENDECTOMY    . CATARACT EXTRACTION, BILATERAL    . COLONOSCOPY  2009  . DENTAL SURGERY    . NASAL SEPTUM SURGERY      FAMILY HISTORY: Family History  Problem Relation Age of Onset  . Dementia Maternal Aunt   . Dementia Paternal Uncle   . Colon cancer Neg Hx   . Colon polyps Neg Hx   . Esophageal cancer Neg Hx   . Rectal cancer Neg Hx   . Stomach cancer Neg Hx     SOCIAL HISTORY: Social History   Socioeconomic History  . Marital status: Divorced    Spouse name: Not on file  . Number of children: Not on file  . Years of education: Not on file  . Highest education level: Not on file  Occupational History  . Not on file  Tobacco Use  . Smoking status: Never Smoker  . Smokeless tobacco: Never Used  Vaping Use  . Vaping Use: Never used  Substance and Sexual Activity  . Alcohol use: No  . Drug use: No  . Sexual activity: Not on file  Other Topics Concern  . Not on file  Social History Narrative   Pt lives alone in 2 story home   Divorced   No children   Has associates degree   Works as Land.    Social Determinants of Health   Financial Resource Strain: Not on file   Food Insecurity: Not on file  Transportation Needs: Not on file  Physical Activity: Not on file  Stress: Not on file  Social Connections: Not on file  Intimate Partner Violence: Not on file      Marcial Pacas, M.D. Ph.D.  [facility] [address], [place], [place] [address] Ph: [phone] Fax: [phone]  CC:  Tisovec, Fransico Him, MD [place],  [place] [phone]  Tisovec, Fransico Him, MD

## 2021-01-17 ENCOUNTER — Other Ambulatory Visit: Payer: Self-pay

## 2021-01-17 ENCOUNTER — Ambulatory Visit (HOSPITAL_COMMUNITY)
Admission: RE | Admit: 2021-01-17 | Discharge: 2021-01-17 | Disposition: A | Discharge status: Home / Self Care | Payer: PPO | Source: Ambulatory Visit | Attending: Internal Medicine | Admitting: Internal Medicine

## 2021-01-17 DIAGNOSIS — R531 Weakness: Secondary | ICD-10-CM | POA: Insufficient documentation

## 2021-01-23 ENCOUNTER — Ambulatory Visit (HOSPITAL_COMMUNITY)
Admission: RE | Admit: 2021-01-23 | Discharge: 2021-01-23 | Disposition: A | Discharge status: Home / Self Care | Payer: PPO | Source: Ambulatory Visit | Attending: Internal Medicine | Admitting: Internal Medicine

## 2021-01-23 ENCOUNTER — Other Ambulatory Visit: Payer: Self-pay

## 2021-01-23 DIAGNOSIS — R531 Weakness: Secondary | ICD-10-CM | POA: Diagnosis not present

## 2021-01-23 DIAGNOSIS — R4701 Aphasia: Secondary | ICD-10-CM | POA: Diagnosis not present

## 2021-01-23 DIAGNOSIS — I10 Essential (primary) hypertension: Secondary | ICD-10-CM | POA: Diagnosis not present

## 2021-01-23 DIAGNOSIS — R270 Ataxia, unspecified: Secondary | ICD-10-CM | POA: Insufficient documentation

## 2021-01-23 DIAGNOSIS — I639 Cerebral infarction, unspecified: Secondary | ICD-10-CM | POA: Insufficient documentation

## 2021-01-23 DIAGNOSIS — I6389 Other cerebral infarction: Secondary | ICD-10-CM | POA: Diagnosis not present

## 2021-01-23 LAB — ECHOCARDIOGRAM COMPLETE
Area-P 1/2: 3.34 cm2
S' Lateral: 2.9 cm

## 2021-01-23 NOTE — Progress Notes (Signed)
  Echocardiogram 2D Echocardiogram has been performed.  Jennette Dubin 01/23/2021, 10:56 AM

## 2021-05-24 IMAGING — MR MR HEAD WO/W CM
14 of 16 series · 40 of 48 positions shown · IV contrast (gadavist)
Comparison: MRI of the brain October 30, 2017.

CLINICAL DATA: Ataxia, weakness.  Stroke

EXAM:
MRI HEAD WITHOUT AND WITH CONTRAST
TECHNIQUE: Multiplanar, multiecho pulse sequences of the brain and surrounding
structures were obtained without and with intravenous contrast.
CONTRAST:  8mL GADAVIST GADOBUTROL 1 MMOL/ML IV SOLN

[Series 5: DWI · axial · 3.0mm · 0.92mm/px · z∈[-85,+72]mm · 6 of 108 slices shown (1 of 4)]
[im 1/108]
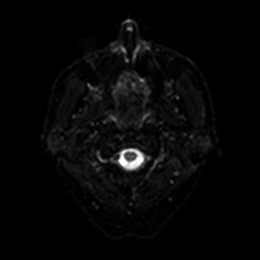
[im 22/108]
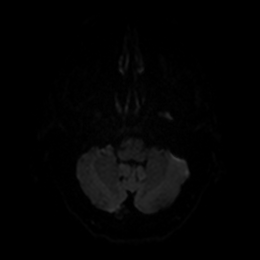
[im 43/108]
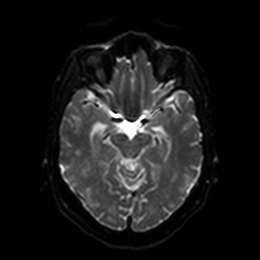
[im 65/108]
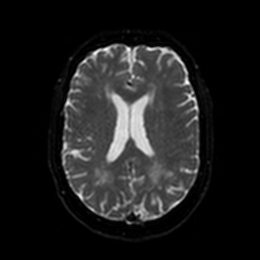
[im 86/108]
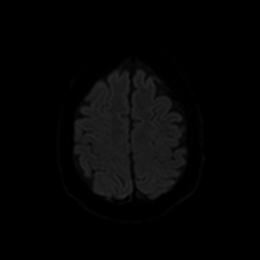
[im 108/108]
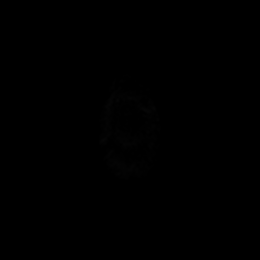

[Series 6: DWI · axial · 3.0mm · 0.92mm/px · z∈[-85,+72]mm · 2 of 54 slices shown (2 of 4)]
[im 1/54]
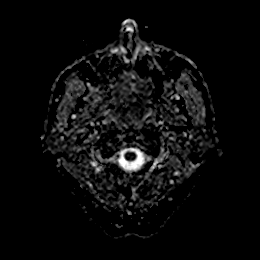
[im 54/54]
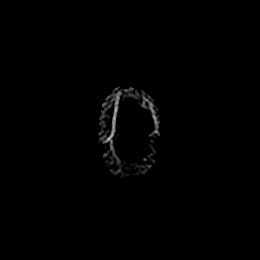

[Series 7: DWI · coronal · 4.0mm · 0.88mm/px · 5 of 80 slices shown (3 of 4)]
[im 1/80]
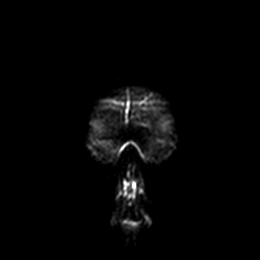
[im 20/80]
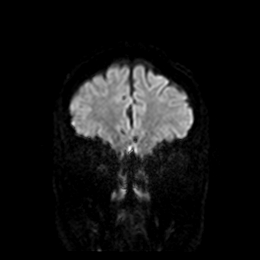
[im 40/80]
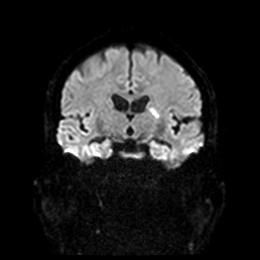
[im 60/80]
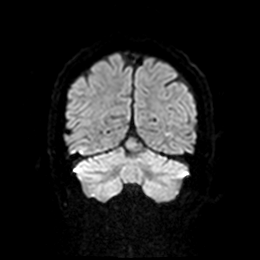
[im 80/80]
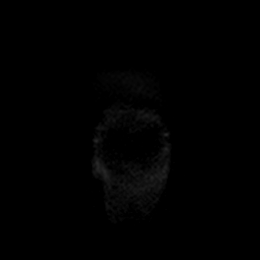

[Series 8: DWI · coronal · 4.0mm · 0.88mm/px · 2 of 40 slices shown (4 of 4)]
[im 1/40]
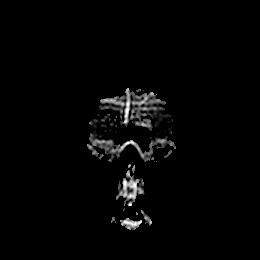
[im 40/40]
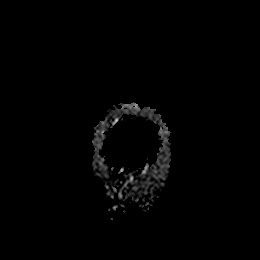

[Series 9: T1 · sagittal · 5.0mm · 0.75mm/px · 1 of 23 slices shown]
[im 1/23]
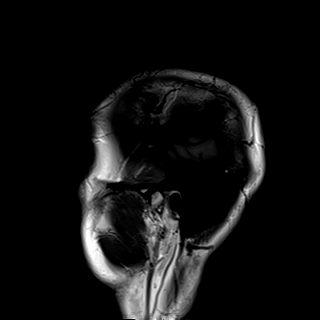

[Series 10: T2 · axial · 5.0mm · 0.72mm/px · z∈[-96,+76]mm · 2 of 30 slices shown]
[im 1/30]
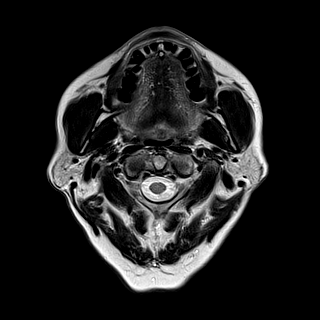
[im 30/30]
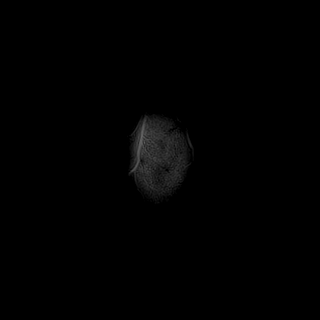

[Series 11: FLAIR · axial · 5.0mm · 0.45mm/px · z∈[-95,+77]mm · 2 of 30 slices shown]
[im 1/30]
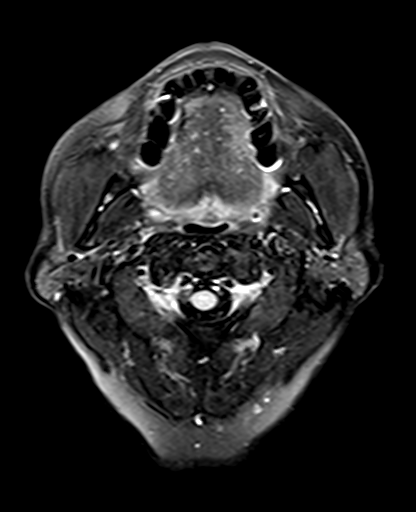
[im 30/30]
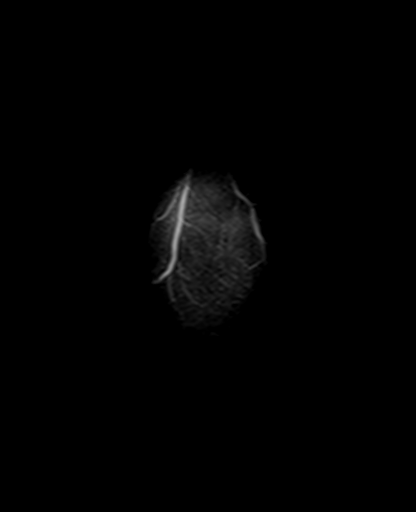

[Series 12: mag_images · axial · 3.0mm · 0.90mm/px · z∈[-89,+86]mm · 4 of 60 slices shown]
[im 1/60]
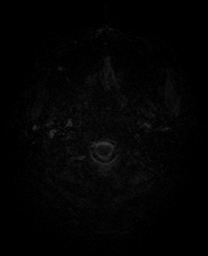
[im 20/60]
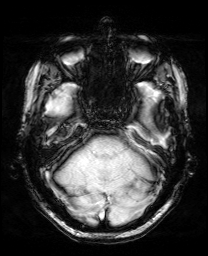
[im 40/60]
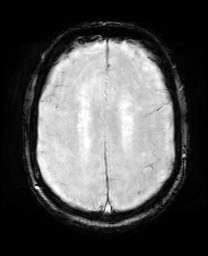
[im 60/60]
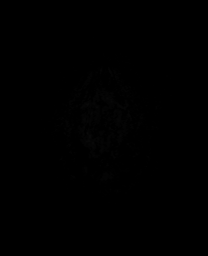

[Series 13: pha_images · axial · 3.0mm · 0.90mm/px · z∈[-89,+83]mm · 4 of 59 slices shown]
[im 1/59]
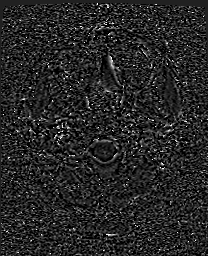
[im 20/59]
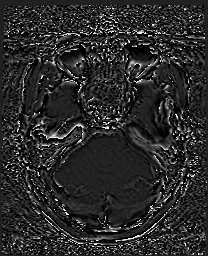
[im 39/59]
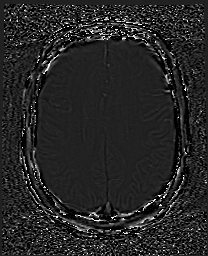
[im 59/59]
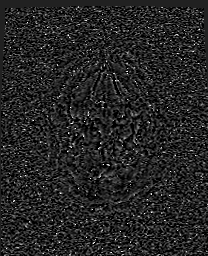

[Series 14: swi_images · axial · 3.0mm · 0.90mm/px · z∈[-89,+86]mm · 4 of 60 slices shown]
[im 1/60]
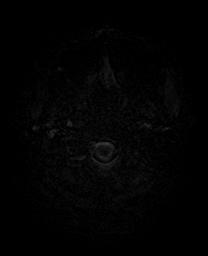
[im 20/60]
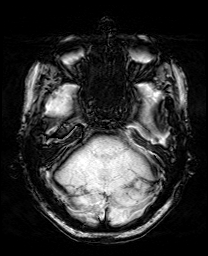
[im 40/60]
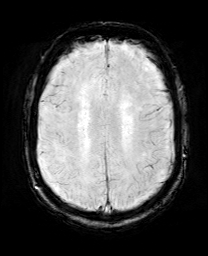
[im 60/60]
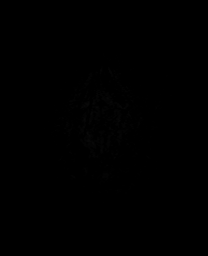

[Series 15: mip_images(sw) · axial · 24.0mm · 0.90mm/px · z∈[-79,+75]mm · 3 of 53 slices shown]
[im 1/53]
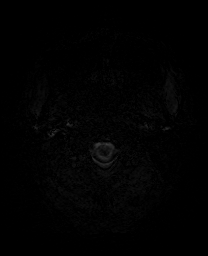
[im 27/53]
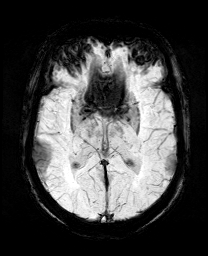
[im 53/53]
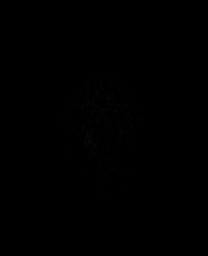

[Series 17: T2 post-contrast · coronal · 5.0mm · 0.72mm/px · 2 of 28 slices shown]
[im 1/28]
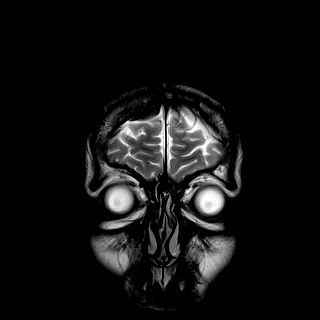
[im 28/28]
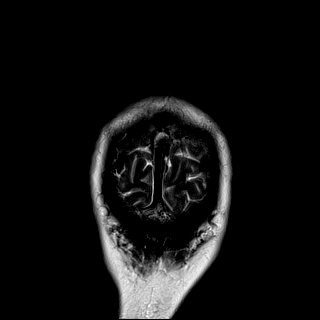

[Series 19: T1 post-contrast · coronal · 5.0mm · 0.34mm/px · 2 of 28 slices shown (1 of 2)]
[im 1/28]
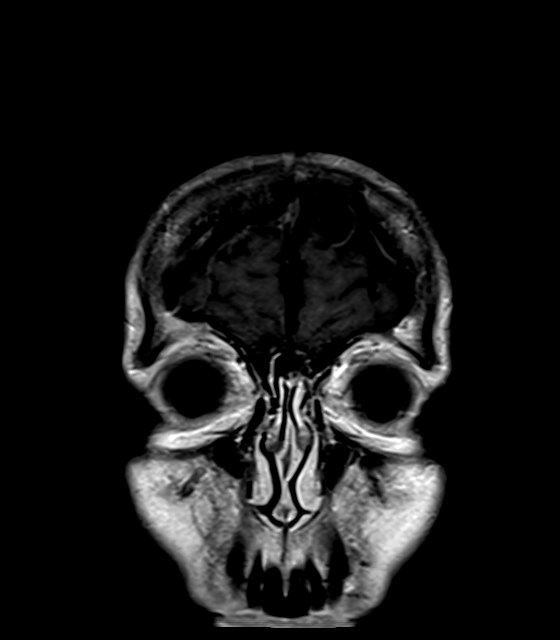
[im 28/28]
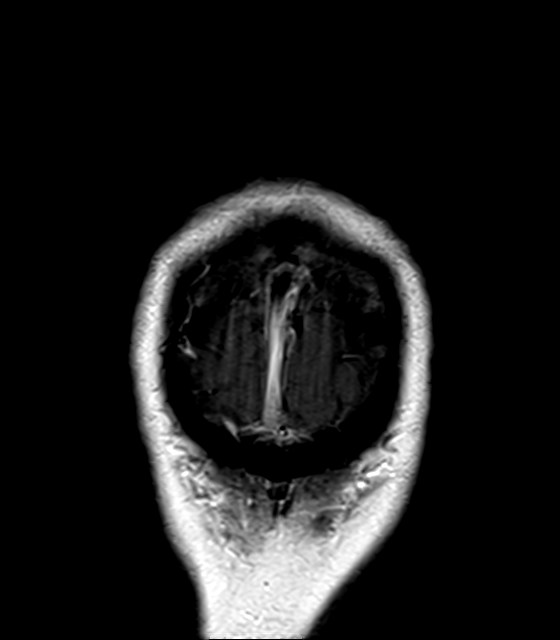

[Series 20: T1 post-contrast · sagittal · 5.0mm · 0.72mm/px · 1 of 23 slices shown (2 of 2)]
[im 1/23]
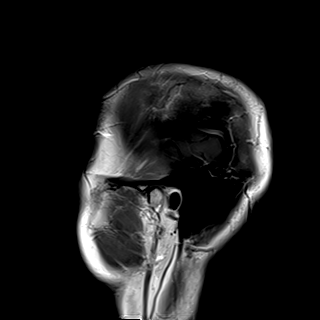

[40 of 48 positions shown; findings below may reference images not displayed]

FINDINGS: Brain: Small area of restricted diffusion is seen in the left
lenticulocapsular region extending to the corona radiata, consistent
with recent infarct. Scattered foci of T2 hyperintensity are seen
within the white matter of the cerebral hemispheres, nonspecific
significantly, progressed from prior MRI. No focus of abnormal
contrast enhancement.

Vascular: Normal flow voids.

Skull and upper cervical spine: Normal marrow signal.

Sinuses/Orbits: Negative.

Other: None.
IMPRESSION: 1. Acute/subacute infarct in the left lenticulocapsular region.
2. Moderate chronic microvascular ischemic changes of the white
matter, significantly progressed from prior MRI.

These results will be called to the ordering clinician or
representative by the Radiologist Assistant, and communication
documented in the PACS or [REDACTED].

## 2021-06-28 DIAGNOSIS — I10 Essential (primary) hypertension: Secondary | ICD-10-CM | POA: Diagnosis not present

## 2021-06-28 DIAGNOSIS — E7800 Pure hypercholesterolemia, unspecified: Secondary | ICD-10-CM | POA: Diagnosis not present

## 2021-07-05 DIAGNOSIS — Z23 Encounter for immunization: Secondary | ICD-10-CM | POA: Diagnosis not present

## 2021-07-05 DIAGNOSIS — G629 Polyneuropathy, unspecified: Secondary | ICD-10-CM | POA: Diagnosis not present

## 2021-07-05 DIAGNOSIS — Z125 Encounter for screening for malignant neoplasm of prostate: Secondary | ICD-10-CM | POA: Diagnosis not present

## 2021-07-05 DIAGNOSIS — Z8673 Personal history of transient ischemic attack (TIA), and cerebral infarction without residual deficits: Secondary | ICD-10-CM | POA: Diagnosis not present

## 2021-07-05 DIAGNOSIS — F900 Attention-deficit hyperactivity disorder, predominantly inattentive type: Secondary | ICD-10-CM | POA: Diagnosis not present

## 2021-07-05 DIAGNOSIS — Z1331 Encounter for screening for depression: Secondary | ICD-10-CM | POA: Diagnosis not present

## 2021-07-05 DIAGNOSIS — E7800 Pure hypercholesterolemia, unspecified: Secondary | ICD-10-CM | POA: Diagnosis not present

## 2021-07-05 DIAGNOSIS — Z0000 Encounter for general adult medical examination without abnormal findings: Secondary | ICD-10-CM | POA: Diagnosis not present

## 2021-07-05 DIAGNOSIS — R270 Ataxia, unspecified: Secondary | ICD-10-CM | POA: Diagnosis not present

## 2021-07-05 DIAGNOSIS — R82998 Other abnormal findings in urine: Secondary | ICD-10-CM | POA: Diagnosis not present

## 2021-07-05 DIAGNOSIS — I10 Essential (primary) hypertension: Secondary | ICD-10-CM | POA: Diagnosis not present

## 2021-07-05 DIAGNOSIS — I679 Cerebrovascular disease, unspecified: Secondary | ICD-10-CM | POA: Diagnosis not present

## 2021-07-05 DIAGNOSIS — N401 Enlarged prostate with lower urinary tract symptoms: Secondary | ICD-10-CM | POA: Diagnosis not present

## 2021-07-05 DIAGNOSIS — Z1389 Encounter for screening for other disorder: Secondary | ICD-10-CM | POA: Diagnosis not present

## 2021-11-09 DIAGNOSIS — H35373 Puckering of macula, bilateral: Secondary | ICD-10-CM | POA: Diagnosis not present

## 2022-01-08 DIAGNOSIS — G629 Polyneuropathy, unspecified: Secondary | ICD-10-CM | POA: Diagnosis not present

## 2022-01-08 DIAGNOSIS — Z1331 Encounter for screening for depression: Secondary | ICD-10-CM | POA: Diagnosis not present

## 2022-01-08 DIAGNOSIS — D126 Benign neoplasm of colon, unspecified: Secondary | ICD-10-CM | POA: Diagnosis not present

## 2022-01-08 DIAGNOSIS — N529 Male erectile dysfunction, unspecified: Secondary | ICD-10-CM | POA: Diagnosis not present

## 2022-01-08 DIAGNOSIS — Z8673 Personal history of transient ischemic attack (TIA), and cerebral infarction without residual deficits: Secondary | ICD-10-CM | POA: Diagnosis not present

## 2022-01-08 DIAGNOSIS — E7800 Pure hypercholesterolemia, unspecified: Secondary | ICD-10-CM | POA: Diagnosis not present

## 2022-01-08 DIAGNOSIS — F900 Attention-deficit hyperactivity disorder, predominantly inattentive type: Secondary | ICD-10-CM | POA: Diagnosis not present

## 2022-01-08 DIAGNOSIS — R4701 Aphasia: Secondary | ICD-10-CM | POA: Diagnosis not present

## 2022-01-08 DIAGNOSIS — R270 Ataxia, unspecified: Secondary | ICD-10-CM | POA: Diagnosis not present

## 2022-01-08 DIAGNOSIS — N401 Enlarged prostate with lower urinary tract symptoms: Secondary | ICD-10-CM | POA: Diagnosis not present

## 2022-01-08 DIAGNOSIS — I679 Cerebrovascular disease, unspecified: Secondary | ICD-10-CM | POA: Diagnosis not present

## 2022-01-08 DIAGNOSIS — I10 Essential (primary) hypertension: Secondary | ICD-10-CM | POA: Diagnosis not present

## 2022-01-22 DIAGNOSIS — I10 Essential (primary) hypertension: Secondary | ICD-10-CM | POA: Diagnosis not present

## 2022-01-22 DIAGNOSIS — I679 Cerebrovascular disease, unspecified: Secondary | ICD-10-CM | POA: Diagnosis not present

## 2022-01-22 DIAGNOSIS — N401 Enlarged prostate with lower urinary tract symptoms: Secondary | ICD-10-CM | POA: Diagnosis not present

## 2022-01-24 ENCOUNTER — Other Ambulatory Visit: Payer: Self-pay | Admitting: Neurology

## 2022-02-19 DIAGNOSIS — I679 Cerebrovascular disease, unspecified: Secondary | ICD-10-CM | POA: Diagnosis not present

## 2022-02-19 DIAGNOSIS — I10 Essential (primary) hypertension: Secondary | ICD-10-CM | POA: Diagnosis not present

## 2022-05-09 DIAGNOSIS — H35373 Puckering of macula, bilateral: Secondary | ICD-10-CM | POA: Diagnosis not present

## 2022-07-10 DIAGNOSIS — Z125 Encounter for screening for malignant neoplasm of prostate: Secondary | ICD-10-CM | POA: Diagnosis not present

## 2022-07-10 DIAGNOSIS — E7800 Pure hypercholesterolemia, unspecified: Secondary | ICD-10-CM | POA: Diagnosis not present

## 2022-07-10 DIAGNOSIS — R7989 Other specified abnormal findings of blood chemistry: Secondary | ICD-10-CM | POA: Diagnosis not present

## 2022-07-10 DIAGNOSIS — I10 Essential (primary) hypertension: Secondary | ICD-10-CM | POA: Diagnosis not present

## 2022-07-17 DIAGNOSIS — Z23 Encounter for immunization: Secondary | ICD-10-CM | POA: Diagnosis not present

## 2022-07-17 DIAGNOSIS — R4701 Aphasia: Secondary | ICD-10-CM | POA: Diagnosis not present

## 2022-07-17 DIAGNOSIS — Z1331 Encounter for screening for depression: Secondary | ICD-10-CM | POA: Diagnosis not present

## 2022-07-17 DIAGNOSIS — F900 Attention-deficit hyperactivity disorder, predominantly inattentive type: Secondary | ICD-10-CM | POA: Diagnosis not present

## 2022-07-17 DIAGNOSIS — Z0000 Encounter for general adult medical examination without abnormal findings: Secondary | ICD-10-CM | POA: Diagnosis not present

## 2022-07-17 DIAGNOSIS — E7800 Pure hypercholesterolemia, unspecified: Secondary | ICD-10-CM | POA: Diagnosis not present

## 2022-07-17 DIAGNOSIS — Z8673 Personal history of transient ischemic attack (TIA), and cerebral infarction without residual deficits: Secondary | ICD-10-CM | POA: Diagnosis not present

## 2022-07-17 DIAGNOSIS — I679 Cerebrovascular disease, unspecified: Secondary | ICD-10-CM | POA: Diagnosis not present

## 2022-07-17 DIAGNOSIS — G629 Polyneuropathy, unspecified: Secondary | ICD-10-CM | POA: Diagnosis not present

## 2022-07-17 DIAGNOSIS — R82998 Other abnormal findings in urine: Secondary | ICD-10-CM | POA: Diagnosis not present

## 2022-07-17 DIAGNOSIS — I10 Essential (primary) hypertension: Secondary | ICD-10-CM | POA: Diagnosis not present

## 2022-07-17 DIAGNOSIS — Z1339 Encounter for screening examination for other mental health and behavioral disorders: Secondary | ICD-10-CM | POA: Diagnosis not present

## 2022-07-17 DIAGNOSIS — Z1212 Encounter for screening for malignant neoplasm of rectum: Secondary | ICD-10-CM | POA: Diagnosis not present

## 2022-07-17 DIAGNOSIS — R270 Ataxia, unspecified: Secondary | ICD-10-CM | POA: Diagnosis not present

## 2022-11-11 DIAGNOSIS — Z961 Presence of intraocular lens: Secondary | ICD-10-CM | POA: Diagnosis not present

## 2022-11-11 DIAGNOSIS — H35373 Puckering of macula, bilateral: Secondary | ICD-10-CM | POA: Diagnosis not present

## 2023-01-21 DIAGNOSIS — I10 Essential (primary) hypertension: Secondary | ICD-10-CM | POA: Diagnosis not present

## 2023-01-21 DIAGNOSIS — E7800 Pure hypercholesterolemia, unspecified: Secondary | ICD-10-CM | POA: Diagnosis not present

## 2023-01-21 DIAGNOSIS — N401 Enlarged prostate with lower urinary tract symptoms: Secondary | ICD-10-CM | POA: Diagnosis not present

## 2023-01-21 DIAGNOSIS — F900 Attention-deficit hyperactivity disorder, predominantly inattentive type: Secondary | ICD-10-CM | POA: Diagnosis not present

## 2023-01-21 DIAGNOSIS — R4701 Aphasia: Secondary | ICD-10-CM | POA: Diagnosis not present

## 2023-01-21 DIAGNOSIS — I679 Cerebrovascular disease, unspecified: Secondary | ICD-10-CM | POA: Diagnosis not present

## 2023-01-21 DIAGNOSIS — R270 Ataxia, unspecified: Secondary | ICD-10-CM | POA: Diagnosis not present

## 2023-01-21 DIAGNOSIS — N529 Male erectile dysfunction, unspecified: Secondary | ICD-10-CM | POA: Diagnosis not present

## 2023-01-21 DIAGNOSIS — Z8673 Personal history of transient ischemic attack (TIA), and cerebral infarction without residual deficits: Secondary | ICD-10-CM | POA: Diagnosis not present

## 2023-01-21 DIAGNOSIS — G629 Polyneuropathy, unspecified: Secondary | ICD-10-CM | POA: Diagnosis not present

## 2023-07-15 DIAGNOSIS — N401 Enlarged prostate with lower urinary tract symptoms: Secondary | ICD-10-CM | POA: Diagnosis not present

## 2023-07-15 DIAGNOSIS — R7989 Other specified abnormal findings of blood chemistry: Secondary | ICD-10-CM | POA: Diagnosis not present

## 2023-07-15 DIAGNOSIS — I10 Essential (primary) hypertension: Secondary | ICD-10-CM | POA: Diagnosis not present

## 2023-07-15 DIAGNOSIS — E7800 Pure hypercholesterolemia, unspecified: Secondary | ICD-10-CM | POA: Diagnosis not present

## 2023-07-22 DIAGNOSIS — Z1331 Encounter for screening for depression: Secondary | ICD-10-CM | POA: Diagnosis not present

## 2023-07-22 DIAGNOSIS — Z1339 Encounter for screening examination for other mental health and behavioral disorders: Secondary | ICD-10-CM | POA: Diagnosis not present

## 2023-07-22 DIAGNOSIS — F900 Attention-deficit hyperactivity disorder, predominantly inattentive type: Secondary | ICD-10-CM | POA: Diagnosis not present

## 2023-07-22 DIAGNOSIS — E7800 Pure hypercholesterolemia, unspecified: Secondary | ICD-10-CM | POA: Diagnosis not present

## 2023-07-22 DIAGNOSIS — I679 Cerebrovascular disease, unspecified: Secondary | ICD-10-CM | POA: Diagnosis not present

## 2023-07-22 DIAGNOSIS — N401 Enlarged prostate with lower urinary tract symptoms: Secondary | ICD-10-CM | POA: Diagnosis not present

## 2023-07-22 DIAGNOSIS — Z1212 Encounter for screening for malignant neoplasm of rectum: Secondary | ICD-10-CM | POA: Diagnosis not present

## 2023-07-22 DIAGNOSIS — Z0000 Encounter for general adult medical examination without abnormal findings: Secondary | ICD-10-CM | POA: Diagnosis not present

## 2023-07-22 DIAGNOSIS — G629 Polyneuropathy, unspecified: Secondary | ICD-10-CM | POA: Diagnosis not present

## 2023-07-22 DIAGNOSIS — Z23 Encounter for immunization: Secondary | ICD-10-CM | POA: Diagnosis not present

## 2023-07-22 DIAGNOSIS — I10 Essential (primary) hypertension: Secondary | ICD-10-CM | POA: Diagnosis not present

## 2023-07-22 DIAGNOSIS — Z8673 Personal history of transient ischemic attack (TIA), and cerebral infarction without residual deficits: Secondary | ICD-10-CM | POA: Diagnosis not present

## 2023-07-22 DIAGNOSIS — R4701 Aphasia: Secondary | ICD-10-CM | POA: Diagnosis not present

## 2023-11-11 DIAGNOSIS — H53461 Homonymous bilateral field defects, right side: Secondary | ICD-10-CM | POA: Diagnosis not present

## 2023-11-12 ENCOUNTER — Inpatient Hospital Stay (HOSPITAL_COMMUNITY)
Admission: EM | Admit: 2023-11-12 | Discharge: 2023-11-14 | DRG: 064 | Disposition: A | Discharge status: Home Health | Attending: Neurology | Admitting: Neurology

## 2023-11-12 ENCOUNTER — Other Ambulatory Visit (HOSPITAL_COMMUNITY): Payer: Self-pay | Admitting: Registered Nurse

## 2023-11-12 ENCOUNTER — Inpatient Hospital Stay (HOSPITAL_COMMUNITY)

## 2023-11-12 ENCOUNTER — Encounter (HOSPITAL_COMMUNITY): Payer: Self-pay

## 2023-11-12 ENCOUNTER — Other Ambulatory Visit: Payer: Self-pay

## 2023-11-12 ENCOUNTER — Ambulatory Visit (HOSPITAL_COMMUNITY)
Admission: RE | Admit: 2023-11-12 | Discharge: 2023-11-12 | Disposition: A | Discharge status: Home / Self Care | Source: Ambulatory Visit | Attending: Registered Nurse

## 2023-11-12 DIAGNOSIS — R29702 NIHSS score 2: Secondary | ICD-10-CM | POA: Diagnosis present

## 2023-11-12 DIAGNOSIS — G936 Cerebral edema: Secondary | ICD-10-CM | POA: Diagnosis present

## 2023-11-12 DIAGNOSIS — R29703 NIHSS score 3: Secondary | ICD-10-CM | POA: Diagnosis not present

## 2023-11-12 DIAGNOSIS — R519 Headache, unspecified: Secondary | ICD-10-CM | POA: Insufficient documentation

## 2023-11-12 DIAGNOSIS — Z8673 Personal history of transient ischemic attack (TIA), and cerebral infarction without residual deficits: Secondary | ICD-10-CM

## 2023-11-12 DIAGNOSIS — Z23 Encounter for immunization: Secondary | ICD-10-CM | POA: Diagnosis not present

## 2023-11-12 DIAGNOSIS — Z7982 Long term (current) use of aspirin: Secondary | ICD-10-CM | POA: Diagnosis not present

## 2023-11-12 DIAGNOSIS — I629 Nontraumatic intracranial hemorrhage, unspecified: Principal | ICD-10-CM | POA: Diagnosis present

## 2023-11-12 DIAGNOSIS — I10 Essential (primary) hypertension: Secondary | ICD-10-CM | POA: Diagnosis present

## 2023-11-12 DIAGNOSIS — Z820 Family history of epilepsy and other diseases of the nervous system: Secondary | ICD-10-CM

## 2023-11-12 DIAGNOSIS — H53451 Other localized visual field defect, right eye: Secondary | ICD-10-CM

## 2023-11-12 DIAGNOSIS — R270 Ataxia, unspecified: Secondary | ICD-10-CM | POA: Diagnosis not present

## 2023-11-12 DIAGNOSIS — C7931 Secondary malignant neoplasm of brain: Secondary | ICD-10-CM | POA: Diagnosis not present

## 2023-11-12 DIAGNOSIS — H53461 Homonymous bilateral field defects, right side: Secondary | ICD-10-CM | POA: Diagnosis not present

## 2023-11-12 DIAGNOSIS — Z79899 Other long term (current) drug therapy: Secondary | ICD-10-CM | POA: Diagnosis not present

## 2023-11-12 DIAGNOSIS — I6201 Nontraumatic acute subdural hemorrhage: Secondary | ICD-10-CM | POA: Diagnosis not present

## 2023-11-12 DIAGNOSIS — E669 Obesity, unspecified: Secondary | ICD-10-CM | POA: Diagnosis not present

## 2023-11-12 DIAGNOSIS — C801 Malignant (primary) neoplasm, unspecified: Secondary | ICD-10-CM | POA: Diagnosis not present

## 2023-11-12 DIAGNOSIS — I611 Nontraumatic intracerebral hemorrhage in hemisphere, cortical: Secondary | ICD-10-CM | POA: Diagnosis not present

## 2023-11-12 DIAGNOSIS — H538 Other visual disturbances: Secondary | ICD-10-CM | POA: Diagnosis not present

## 2023-11-12 DIAGNOSIS — I161 Hypertensive emergency: Secondary | ICD-10-CM | POA: Diagnosis not present

## 2023-11-12 DIAGNOSIS — S06320A Contusion and laceration of left cerebrum without loss of consciousness, initial encounter: Secondary | ICD-10-CM | POA: Diagnosis not present

## 2023-11-12 DIAGNOSIS — Z6824 Body mass index (BMI) 24.0-24.9, adult: Secondary | ICD-10-CM | POA: Diagnosis not present

## 2023-11-12 DIAGNOSIS — K5730 Diverticulosis of large intestine without perforation or abscess without bleeding: Secondary | ICD-10-CM | POA: Diagnosis not present

## 2023-11-12 DIAGNOSIS — R4701 Aphasia: Secondary | ICD-10-CM | POA: Diagnosis not present

## 2023-11-12 DIAGNOSIS — I679 Cerebrovascular disease, unspecified: Secondary | ICD-10-CM | POA: Diagnosis not present

## 2023-11-12 DIAGNOSIS — I618 Other nontraumatic intracerebral hemorrhage: Principal | ICD-10-CM | POA: Diagnosis present

## 2023-11-12 DIAGNOSIS — Z7902 Long term (current) use of antithrombotics/antiplatelets: Secondary | ICD-10-CM

## 2023-11-12 DIAGNOSIS — I619 Nontraumatic intracerebral hemorrhage, unspecified: Secondary | ICD-10-CM | POA: Diagnosis present

## 2023-11-12 DIAGNOSIS — H9190 Unspecified hearing loss, unspecified ear: Secondary | ICD-10-CM | POA: Diagnosis present

## 2023-11-12 DIAGNOSIS — K439 Ventral hernia without obstruction or gangrene: Secondary | ICD-10-CM | POA: Diagnosis not present

## 2023-11-12 DIAGNOSIS — E785 Hyperlipidemia, unspecified: Secondary | ICD-10-CM | POA: Diagnosis present

## 2023-11-12 DIAGNOSIS — I6782 Cerebral ischemia: Secondary | ICD-10-CM | POA: Diagnosis not present

## 2023-11-12 LAB — CBC WITH DIFFERENTIAL/PLATELET
Abs Immature Granulocytes: 0.05 10*3/uL (ref 0.00–0.07)
Basophils Absolute: 0.1 10*3/uL (ref 0.0–0.1)
Basophils Relative: 1 %
Eosinophils Absolute: 0.1 10*3/uL (ref 0.0–0.5)
Eosinophils Relative: 1 %
HCT: 40.9 % (ref 39.0–52.0)
Hemoglobin: 13.6 g/dL (ref 13.0–17.0)
Immature Granulocytes: 1 %
Lymphocytes Relative: 17 %
Lymphs Abs: 1.8 10*3/uL (ref 0.7–4.0)
MCH: 30.3 pg (ref 26.0–34.0)
MCHC: 33.3 g/dL (ref 30.0–36.0)
MCV: 91.1 fL (ref 80.0–100.0)
Monocytes Absolute: 1 10*3/uL (ref 0.1–1.0)
Monocytes Relative: 9 %
Neutro Abs: 7.8 10*3/uL — ABNORMAL HIGH (ref 1.7–7.7)
Neutrophils Relative %: 71 %
Platelets: 288 10*3/uL (ref 150–400)
RBC: 4.49 MIL/uL (ref 4.22–5.81)
RDW: 12.2 % (ref 11.5–15.5)
WBC: 10.9 10*3/uL — ABNORMAL HIGH (ref 4.0–10.5)
nRBC: 0 % (ref 0.0–0.2)

## 2023-11-12 LAB — LIPID PANEL
Cholesterol: 110 mg/dL (ref 0–200)
HDL: 56 mg/dL (ref >40)
LDL Cholesterol: 39 mg/dL (ref 0–99)
Total CHOL/HDL Ratio: 2 ratio
Triglycerides: 75 mg/dL (ref <150)
VLDL: 15 mg/dL (ref 0–40)

## 2023-11-12 LAB — COMPREHENSIVE METABOLIC PANEL
ALT: 21 U/L (ref 0–44)
AST: 26 U/L (ref 15–41)
Albumin: 3.8 g/dL (ref 3.5–5.0)
Alkaline Phosphatase: 83 U/L (ref 38–126)
Anion gap: 8 (ref 5–15)
BUN: 12 mg/dL (ref 8–23)
CO2: 23 mmol/L (ref 22–32)
Calcium: 8.9 mg/dL (ref 8.9–10.3)
Chloride: 104 mmol/L (ref 98–111)
Creatinine, Ser: 0.94 mg/dL (ref 0.61–1.24)
GFR, Estimated: >60 mL/min (ref >60)
Glucose, Bld: 122 mg/dL — ABNORMAL HIGH (ref 70–99)
Potassium: 3.6 mmol/L (ref 3.5–5.1)
Sodium: 135 mmol/L (ref 135–145)
Total Bilirubin: 0.7 mg/dL (ref 0.0–1.2)
Total Protein: 7.1 g/dL (ref 6.5–8.1)

## 2023-11-12 LAB — HEMOGLOBIN A1C
Hgb A1c MFr Bld: 5.9 % — ABNORMAL HIGH (ref 4.8–5.6)
Mean Plasma Glucose: 122.63 mg/dL

## 2023-11-12 MED ORDER — STROKE: EARLY STAGES OF RECOVERY BOOK
Freq: Once | Status: AC
  Filled 2023-11-12: qty 1

## 2023-11-12 MED ORDER — ACETAMINOPHEN 160 MG/5ML PO SOLN: 650.0000 mg | ORAL | Status: DC | PRN

## 2023-11-12 MED ORDER — CLEVIDIPINE BUTYRATE 0.5 MG/ML IV EMUL
0.0000 mg/h | INTRAVENOUS | Status: DC
  Administered 2023-11-12: 17 mg/h via INTRAVENOUS
  Administered 2023-11-12: 2 mg/h via INTRAVENOUS
  Administered 2023-11-12 (×2): 19 mg/h via INTRAVENOUS
  Administered 2023-11-13: 17 mg/h via INTRAVENOUS
  Administered 2023-11-13: 13 mg/h via INTRAVENOUS
  Administered 2023-11-13: 15 mg/h via INTRAVENOUS
  Filled 2023-11-12: qty 100
  Filled 2023-11-12 (×2): qty 50
  Filled 2023-11-12: qty 100
  Filled 2023-11-12: qty 50
  Filled 2023-11-12: qty 100
  Filled 2023-11-12: qty 50

## 2023-11-12 MED ORDER — SENNOSIDES-DOCUSATE SODIUM 8.6-50 MG PO TABS
1.0000 | ORAL_TABLET | Freq: Two times a day (BID) | ORAL | Status: DC
  Administered 2023-11-13: 1 via ORAL
  Filled 2023-11-12 (×2): qty 1

## 2023-11-12 MED ORDER — GADOBUTROL 1 MMOL/ML IV SOLN
8.0000 mL | Freq: Once | INTRAVENOUS | Status: AC | PRN
  Administered 2023-11-12: 8 mL via INTRAVENOUS

## 2023-11-12 MED ORDER — ACETAMINOPHEN 650 MG RE SUPP: 650.0000 mg | RECTAL | Status: DC | PRN

## 2023-11-12 MED ORDER — ACETAMINOPHEN 325 MG PO TABS: 650.0000 mg | ORAL_TABLET | ORAL | Status: DC | PRN

## 2023-11-12 MED ORDER — PANTOPRAZOLE SODIUM 40 MG IV SOLR: 40.0000 mg | Freq: Every day | INTRAVENOUS | Status: DC

## 2023-11-12 MED ORDER — IOHEXOL 350 MG/ML SOLN
75.0000 mL | Freq: Once | INTRAVENOUS | Status: AC | PRN
  Administered 2023-11-12: 75 mL via INTRAVENOUS

## 2023-11-12 NOTE — ED Provider Notes (Signed)
 [facility] AT [facility] Provider Note   CSN: [id number] Arrival date & time: 11/12/23  1717     History  Chief Complaint  Patient presents with   Eye Problem    Seth Gonzalez is a 73 y.o. male history of previous stroke on Plavix here presenting with blurry vision.  Patient has been having blurry vision for the last several weeks.  When I asked him further, he states that he cannot see out of the right side of his visual field.  Patient states that he saw his ophthalmologist yesterday.  He had a dilated exam and his ophthalmologist was concerned for possible stroke.  He went to see PCP today and had an MRI outpatient with and without contrast that showed left occipital parenchymal hemorrhage with edema as well as a subdural hematoma.  Patient states that he takes his BP meds as prescribed.  Patient has some mild headaches.  Denies any falls.  The history is provided by the patient.       Home Medications Prior to Admission medications   Medication Sig Start Date End Date Taking? Authorizing Provider  aspirin EC 81 MG tablet Take 81 mg by mouth daily.    [provider]  clopidogrel (PLAVIX) 75 MG tablet Take 1 tablet (75 mg total) by mouth daily. 01/16/21   Levert Feinstein, MD  Multiple Vitamin (MULTIVITAMIN) tablet Take 1 tablet by mouth daily.    [provider]  rosuvastatin (CRESTOR) 20 MG tablet Take 20 mg by mouth daily. 01/10/21   [provider]  sildenafil (REVATIO) 20 MG tablet TK 5 TS PO PRF ERECTILE DYSFUNCTION 09/19/17   [provider]  telmisartan (MICARDIS) 40 MG tablet Take 40 mg by mouth daily. 01/10/21   [provider]      Allergies    Patient has no known allergies.    Review of Systems   Review of Systems  Eyes:  Positive for visual disturbance.  Neurological:  Positive for dizziness.  All other systems reviewed and are negative.   Physical Exam Updated Vital Signs BP (!) 173/91    Pulse 93   Temp 98.7 F (37.1 C) (Oral)   Resp 18   Ht 6\' 1"  (1.854 m)   Wt 85.4 kg   SpO2 99%   BMI 24.84 kg/m  Physical Exam Vitals and nursing note reviewed.  HENT:     Head: Normocephalic.     Mouth/Throat:     Mouth: Mucous membranes are moist.  Eyes:     Extraocular Movements: Extraocular movements intact.     Pupils: Pupils are equal, round, and reactive to light.  Cardiovascular:     Rate and Rhythm: Normal rate and regular rhythm.     Heart sounds: Normal heart sounds.  Pulmonary:     Effort: Pulmonary effort is normal.     Breath sounds: Normal breath sounds.  Abdominal:     General: Abdomen is flat.     Palpations: Abdomen is soft.  Musculoskeletal:        General: Normal range of motion.     Cervical back: Normal range of motion.  Skin:    General: Skin is warm.  Neurological:     Mental Status: He is alert.     Comments: Patient has right visual field cut.  Patient has no obvious facial droop.  Patient has normal finger-to-nose bilaterally.  Patient has normal strength bilateral arms and legs.  Patient has negative Romberg sign  and stable gait.  Psychiatric:        Mood and Affect: Mood normal.        Behavior: Behavior normal.     ED Results / Procedures / Treatments   Labs (all labs ordered are listed, but only abnormal results are displayed) Labs Reviewed  CBC WITH DIFFERENTIAL/PLATELET  COMPREHENSIVE METABOLIC PANEL    EKG None  Radiology MR BRAIN W WO CONTRAST Result Date: 11/12/2023 CLINICAL DATA:  Right-sided peripheral vision loss for 3 weeks. Headache. History of stroke. EXAM: MRI HEAD WITHOUT AND WITH CONTRAST TECHNIQUE: Multiplanar, multiecho pulse sequences of the brain and surrounding structures were obtained without and with intravenous contrast. CONTRAST:  8mL GADAVIST GADOBUTROL 1 MMOL/ML IV SOLN COMPARISON:  Head MRI 01/10/2021 FINDINGS: Brain: There is an approximately 4.5-5 cm hemorrhage in the medial left occipital lobe which is  partially intrinsically T1 hyperintense and may be subacute. There is moderate surrounding edema with sulcal effacement and partial effacement of the left lateral ventricle. There is heterogeneous enhancement along the periphery of the hemorrhage of up to moderate thickness, and there is also separate, more confluent enhancement immediately inferior to the hemorrhage in the left occipital lobe which spans 3 cm AP by 2 cm transverse. Mild diffusion weighted signal abnormality along the margins of the hemorrhage demonstrates at most mildly reduced corresponding ADC. No enhancing intracranial lesion is identified elsewhere. No significant chronic hemorrhage is evident elsewhere to suggest underlying cerebral amyloid angiopathy. A small subdural hematoma over the left cerebral convexity measures up to 3 mm in thickness with associated smooth dural enhancement. There is no more than trace rightward midline shift. Patchy T2 hyperintensities elsewhere in the cerebral white matter bilaterally are similar to the prior MRI and are nonspecific but compatible with moderate chronic small vessel ischemic disease. A chronic lacunar infarct in the left corona radiata was acute on the prior MRI. Dedicated imaging was performed through the sella turcica. The pituitary gland is normal in size and enhances homogeneously. No sellar or suprasellar lesion is identified. The pituitary stalk insert slightly left of midline. The optic chiasm and cavernous sinuses are unremarkable. Vascular: Major intracranial vascular flow voids are preserved. The dural venous sinuses are enhancing. Skull and upper cervical spine: Unremarkable bone marrow signal. Sinuses/Orbits: Bilateral cataract extraction. Mild mucosal thickening in the right maxillary sinus. Clear mastoid air cells. Other: None. Critical Value/emergent results were called by telephone at the time of interpretation on 11/12/2023 at 4:50 pm to provider [facility], who verbally acknowledged  these results. IMPRESSION: 1. Left occipital parenchymal hemorrhage with moderate surrounding edema and associated enhancement. This is of indeterminate etiology with considerations including underlying primary or metastatic tumor, hemorrhagic infarct, or vascular malformation. 2. Small subdural hematoma over the left cerebral convexity, presumably reflecting extension of the left occipital hemorrhage. At most trace rightward midline shift. 3. Moderate chronic small vessel ischemic disease. Electronically Signed   By: Sebastian Ache M.D.   On: 11/12/2023 16:52    Procedures Procedures    CRITICAL CARE Performed by: Richardean Canal   Total critical care time: 45 minutes  Critical care time was exclusive of separately billable procedures and treating other patients.  Critical care was necessary to treat or prevent imminent or life-threatening deterioration.  Critical care was time spent personally by me on the following activities: development of treatment plan with patient and/or surrogate as well as nursing, discussions with consultants, evaluation of patient's response to treatment, examination of patient, obtaining history from patient or  surrogate, ordering and performing treatments and interventions, ordering and review of laboratory studies, ordering and review of radiographic studies, pulse oximetry and re-evaluation of patient's condition.   Medications Ordered in ED Medications  clevidipine (CLEVIPREX) infusion 0.5 mg/mL (4 mg/hr Intravenous Rate/Dose Change 11/12/23 1808)    ED Course/ Medical Decision Making/ A&P                                 Medical Decision Making WILMA WUTHRICH is a 73 y.o. male here presenting with right visual field cut.  Patient states that he cannot see out of the right side of his eye and had a dilated eye exam yesterday.  Patient had an outpatient MRI that showed hemorrhagic stroke with subdural.  Concern for possible hypertensive bleed.  Was started on  Cleviprex drip.  Will consult neurosurgery to see if he needs any intervention.  Patient likely need admission by neurology  6:37 PM Discussed with Dr. Franky Macho from neurosurgery.  He reviewed the images and he states that there is no neurosurgical intervention recommended.  Recommend that I talk to neurology  6:50 pm Discussed case with Dr. Celene Kras from neurology.  She recommends CTA head and will admit to the neuro ICU.  Patient is on Cleviprex drip.  Goal blood pressure between 130-150.  Problems Addressed: Intracranial bleeding (HCC): acute illness or injury  Amount and/or Complexity of Data Reviewed Labs: ordered. Decision-making details documented in ED Course. Radiology: ordered and independent interpretation performed. Decision-making details documented in ED Course.  Risk Decision regarding hospitalization.    Final Clinical Impression(s) / ED Diagnoses Final diagnoses:  None    Rx / DC Orders ED Discharge Orders     None         Charlynne Pander, MD 11/12/23 1921

## 2023-11-12 NOTE — ED Triage Notes (Signed)
 Pt was sent over to ED for eval. States saw something on MRI. Pt seen for R eye vision changes. States peripheral vision in R eye changing for a few weeks, getting worse. Intermittent headaches for the last few weeks.

## 2023-11-13 ENCOUNTER — Inpatient Hospital Stay (HOSPITAL_COMMUNITY)

## 2023-11-13 ENCOUNTER — Other Ambulatory Visit: Payer: Self-pay | Admitting: Cardiology

## 2023-11-13 DIAGNOSIS — R29703 NIHSS score 3: Secondary | ICD-10-CM

## 2023-11-13 DIAGNOSIS — I611 Nontraumatic intracerebral hemorrhage in hemisphere, cortical: Secondary | ICD-10-CM | POA: Diagnosis not present

## 2023-11-13 DIAGNOSIS — I10 Essential (primary) hypertension: Secondary | ICD-10-CM

## 2023-11-13 DIAGNOSIS — I639 Cerebral infarction, unspecified: Secondary | ICD-10-CM

## 2023-11-13 LAB — ECHOCARDIOGRAM COMPLETE
AR max vel: 3.56 cm2
AV Peak grad: 8.2 mmHg
Ao pk vel: 1.43 m/s
Area-P 1/2: 3.4 cm2
Height: 73 in
MV VTI: 4.75 cm2
S' Lateral: 3.3 cm
Weight: 3026.47 [oz_av]

## 2023-11-13 LAB — MRSA NEXT GEN BY PCR, NASAL: MRSA by PCR Next Gen: NOT DETECTED

## 2023-11-13 MED ORDER — POTASSIUM CHLORIDE CRYS ER 20 MEQ PO TBCR
40.0000 meq | EXTENDED_RELEASE_TABLET | Freq: Once | ORAL | Status: AC
  Administered 2023-11-13: 40 meq via ORAL
  Filled 2023-11-13: qty 2

## 2023-11-13 MED ORDER — PNEUMOCOCCAL 20-VAL CONJ VACC 0.5 ML IM SUSY
0.5000 mL | PREFILLED_SYRINGE | INTRAMUSCULAR | Status: AC
  Administered 2023-11-14: 0.5 mL via INTRAMUSCULAR
  Filled 2023-11-13: qty 0.5

## 2023-11-13 MED ORDER — CHLORHEXIDINE GLUCONATE CLOTH 2 % EX PADS
6.0000 | MEDICATED_PAD | Freq: Every day | CUTANEOUS | Status: DC
  Administered 2023-11-13: 6 via TOPICAL

## 2023-11-13 MED ORDER — IRBESARTAN 300 MG PO TABS
300.0000 mg | ORAL_TABLET | Freq: Every day | ORAL | Status: DC
  Administered 2023-11-13 – 2023-11-14 (×2): 300 mg via ORAL
  Filled 2023-11-13: qty 1
  Filled 2023-11-13: qty 2

## 2023-11-13 MED ORDER — AMLODIPINE BESYLATE 5 MG PO TABS
5.0000 mg | ORAL_TABLET | Freq: Every day | ORAL | Status: DC
  Administered 2023-11-13 – 2023-11-14 (×2): 5 mg via ORAL
  Filled 2023-11-13 (×2): qty 1

## 2023-11-13 MED ORDER — ORAL CARE MOUTH RINSE: 15.0000 mL | OROMUCOSAL | Status: DC | PRN

## 2023-11-13 MED ORDER — LABETALOL HCL 5 MG/ML IV SOLN: 20.0000 mg | INTRAVENOUS | Status: DC | PRN

## 2023-11-13 MED ORDER — IOHEXOL 350 MG/ML SOLN
75.0000 mL | Freq: Once | INTRAVENOUS | Status: AC | PRN
  Administered 2023-11-13: 75 mL via INTRAVENOUS

## 2023-11-13 MED ORDER — PANTOPRAZOLE SODIUM 40 MG PO TBEC
40.0000 mg | DELAYED_RELEASE_TABLET | Freq: Every day | ORAL | Status: DC
  Administered 2023-11-13 – 2023-11-14 (×2): 40 mg via ORAL
  Filled 2023-11-13 (×2): qty 1

## 2023-11-13 NOTE — H&P (Signed)
 NEUROLOGY H&P NOTE   Date of service: November 13, 2023 Patient Name: Seth Gonzalez MRN:  [id number] DOB:  Sep 17, 1950 Chief Complaint: "Left occipital ICH"  History of Present Illness  Seth Gonzalez is a 73 y.o. male with hx of hypertension, hyperlipidemia, erectile dysfunction, prior history of stroke who presents with right hemianopsia and blurred vision.  Patient reports that he noticed some clouding of his vision in the right superior temporal visual field about 3 months ago.  He also noted some squiggly lines in the same area for the last 3 months.  Then, about 3 weeks ago, this became much more worse and he is lost his vision in the left superior temporal quadrant.  He saw his ophthalmologist who ordered an MRI of the brain which she had yesterday and was notable for left occipital parenchymal hemorrhage with some moderate surrounding edema and some enhancement.  He was directed to the ED.  He had CT head and CTA which demonstrated acute appearing left occipital IPH, no underlying vascular abnormalities noted.  Case was discussed with daytime neurology team and patient was admitted to [facility] neuro ICU.  Last known well: 3 weeks ago Modified rankin score: 0-Completely asymptomatic and back to baseline post- stroke ICH Score: 0 tNKASE: Not offered due to ICH Thrombectomy: not offered due to ICH NIHSS components Score: Comment  1a Level of Conscious 0[]  1[]  2[]  3[]      1b LOC Questions 0[]  1[]  2[]       1c LOC Commands 0[]  1[]  2[]       2 Best Gaze 0[]  1[]  2[]       3 Visual 0[]  1[]  2[]  3[]      4 Facial Palsy 0[]  1[]  2[]  3[]      5a Motor Arm - left 0[]  1[]  2[]  3[]  4[]  UN[]    5b Motor Arm - Right 0[]  1[]  2[]  3[]  4[]  UN[]    6a Motor Leg - Left 0[]  1[]  2[]  3[]  4[]  UN[]    6b Motor Leg - Right 0[]  1[]  2[]  3[]  4[]  UN[]    7 Limb Ataxia 0[]  1[]  2[]  3[]  UN[]     8 Sensory 0[]  1[]  2[]  UN[]      9 Best Language 0[]  1[]  2[]  3[]      10 Dysarthria 0[]  1[]  2[]  UN[]      11 Extinct. and Inattention 0[]   1[]  2[]       TOTAL:       ROS  Comprehensive ROS performed and pertinent positives documented in the HPI.  Past History   Past Medical History:  Diagnosis Date   Hearing loss    Hypertension    Past Surgical History:  Procedure Laterality Date   APPENDECTOMY     CATARACT EXTRACTION, BILATERAL     COLONOSCOPY  2009   DENTAL SURGERY     NASAL SEPTUM SURGERY     Family History  Problem Relation Age of Onset   Dementia Maternal Aunt    Dementia Paternal Uncle    Colon cancer Neg Hx    Colon polyps Neg Hx    Esophageal cancer Neg Hx    Rectal cancer Neg Hx    Stomach cancer Neg Hx    Social History   Socioeconomic History   Marital status: Divorced    Spouse name: Not on file   Number of children: Not on file   Years of education: Not on file   Highest education level: Not on file  Occupational History   Not on file  Tobacco Use  Smoking status: Never   Smokeless tobacco: Never  Vaping Use   Vaping status: Never Used  Substance and Sexual Activity   Alcohol use: No   Drug use: No   Sexual activity: Not on file  Other Topics Concern   Not on file  Social History Narrative   Pt lives alone in 2 story home   Divorced   No children   Has associates degree   Works as Comptroller.    Social Drivers of Corporate investment banker Strain: Not on file  Food Insecurity: Not on file  Transportation Needs: Not on file  Physical Activity: Not on file  Stress: Not on file  Social Connections: Not on file   No Known Allergies  Medications   Medications Prior to Admission  Medication Sig Dispense Refill Last Dose/Taking   amLODipine (NORVASC) 5 MG tablet Take 5 mg by mouth at bedtime.   11/11/2023 Bedtime   AYR 0.65 % nasal spray Place 1 spray into the nose as needed for congestion.   Past Week   clopidogrel (PLAVIX) 75 MG tablet Take 1 tablet (75 mg total) by mouth daily. 90 tablet 3 11/12/2023 at  7:00 AM   loratadine (CLARITIN) 10 MG tablet Take 10  mg by mouth daily as needed (for seasonal allergies).   Taking As Needed   Multiple Vitamin (MULTIVITAMIN) tablet Take 1 tablet by mouth 3 (three) times a week.   Past Week   rosuvastatin (CRESTOR) 20 MG tablet Take 20 mg by mouth daily.   11/12/2023 Morning   sildenafil (REVATIO) 20 MG tablet Take 100 mg by mouth daily as needed (for E.D.).  5 Taking As Needed   telmisartan (MICARDIS) 80 MG tablet Take 80 mg by mouth daily.   11/12/2023 Morning   UNKNOWN TO PATIENT Apply 1 application  topically See admin instructions. Unnamed topical cream for itching/prescribed - Apply to affected area once a day as needed for itching   Taking     Vitals   Vitals:   11/12/23 2150 11/12/23 2200 11/12/23 2300 11/13/23 0000  BP: (!) 153/79 (!) 147/79 (!) 153/87   Pulse: (!) 110 99 (!) 106   Resp:  12 15   Temp: 98.8 F (37.1 C)   98.5 F (36.9 C)  TempSrc: Oral   Oral  SpO2: 95% 95% 95%   Weight: 85.8 kg     Height:         Body mass index is 24.96 kg/m.  Physical Exam   General: Laying comfortably in bed; in no acute distress.  HENT: Normal oropharynx and mucosa. Normal external appearance of ears and nose.  Neck: Supple, no pain or tenderness  CV: No JVD. No peripheral edema.  Pulmonary: Symmetric Chest rise. Normal respiratory effort.  Abdomen: Soft to touch, non-tender.  Ext: No cyanosis, edema, or deformity  Skin: No rash. Normal palpation of skin.   Musculoskeletal: Normal digits and nails by inspection. No clubbing.   Neurologic Examination  Mental status/Cognition: Alert, oriented to self, place, month and year, good attention.  Speech/language: Fluent, comprehension intact, object naming intact, repetition intact.  Cranial nerves:   CN II Pupils equal and reactive to light, right hemianopsia.   CN III,IV,VI EOM intact, no gaze preference or deviation, no nystagmus    CN V normal sensation in V1, V2, and V3 segments bilaterally    CN VII no asymmetry, no nasolabial fold flattening     CN VIII normal hearing to speech  CN IX & X normal palatal elevation, no uvular deviation    CN XI 5/5 head turn and 5/5 shoulder shrug bilaterally    CN XII midline tongue protrusion    Motor:  Muscle bulk: normal, tone normal, pronator drift none tremor none Mvmt Root Nerve  Muscle Right Left Comments  SA C5/6 Ax Deltoid 5 5   EF C5/6 Mc Biceps 5 5   EE C6/7/8 Rad Triceps 5 5   WF C6/7 Med FCR     WE C7/8 PIN ECU     F Ab C8/T1 U ADM/FDI 5 5   HF L1/2/3 Fem Illopsoas 5 5   KE L2/3/4 Fem Quad 5 5   DF L4/5 D Peron Tib Ant 5 5   PF S1/2 Tibial Grc/Sol 5 5    Sensation:  Light touch Intact throughout   Pin prick    Temperature    Vibration   Proprioception    Coordination/Complex Motor:  - Finger to Nose intact bilaterally - Heel to shin intact bilaterally - Rapid alternating movement are normal - Gait: Deferred for patient safety. Labs   CBC:  Recent Labs  Lab 11/12/23 1806  WBC 10.9*  NEUTROABS 7.8*  HGB 13.6  HCT 40.9  MCV 91.1  PLT 288    Basic Metabolic Panel:  Lab Results  Component Value Date   NA 135 11/12/2023   K 3.6 11/12/2023   CO2 23 11/12/2023   GLUCOSE 122 (H) 11/12/2023   BUN 12 11/12/2023   CREATININE 0.94 11/12/2023   CALCIUM 8.9 11/12/2023   GFRNONAA >60 11/12/2023   Lipid Panel:  Lab Results  Component Value Date   LDLCALC 39 11/12/2023   HgbA1c:  Lab Results  Component Value Date   HGBA1C 5.9 (H) 11/12/2023   Urine Drug Screen: No results found for: "LABOPIA", "COCAINSCRNUR", "LABBENZ", "AMPHETMU", "THCU", "LABBARB"  Alcohol Level No results found for: "ETH" INR No results found for: "INR" APTT No results found for: "APTT"  CT angio Head and Neck with contrast(Personally reviewed): 1. Left occipital intraparenchymal hematoma measuring 2.3 x 1.9 cm with moderate surrounding edema. 2. Small amount of parafalcine subdural blood. 3. No vascular abnormality of the intracranial arteries.  MRI Brain(Personally  reviewed): 1. Left occipital parenchymal hemorrhage with moderate surrounding edema and associated enhancement. This is of indeterminate etiology with considerations including underlying primary or metastatic tumor, hemorrhagic infarct, or vascular malformation. 2. Small subdural hematoma over the left cerebral convexity, presumably reflecting extension of the left occipital hemorrhage. At most trace rightward midline shift. 3. Moderate chronic small vessel ischemic disease.  Impression   Seth Gonzalez is a 73 y.o. male with hx of hypertension, hyperlipidemia, erectile dysfunction, prior history of stroke who presents with right hemianopsia and blurred vision that has progressed over the last 20 months and acutely worsened over the last 3 weeks.  He had MRI brain outpatient which demonstrated left occipital intraparenchymal hemorrhage with moderate edema.  He was referred to ED and he was transferred to [facility] neuro ICU for close monitoring.  The duration of his vision loss and the slow progression over the last 3 months, is very atypical for an intraparenchymal hemorrhage.  I suspect that the IPH is likely secondary to another underlying pathology.  He does not have a history of cancer and denies any major weight loss.  Primary Diagnosis:  Nontraumatic intracerebral hemorrhage in hemisphere, cortical  Secondary Diagnosis: Hypertension Emergency (SBP > 180 or DBP > 120 & end organ damage)  Recommendations  - Admit to ICU - Stability scan in 6 hours or STAT with any neurological decline - Frequent neuro checks; q28min for 1 hour, then q1hour - No antiplatelets or anticoagulants due to ICH - SCD for DVT prophylaxis, pharmacological DVT ppx at 24 hours if ICH is stable - Blood pressure control with goal systolic 130 - 150, cleverplex and labetalol PRN - Stroke labs, HgbA1c, fasting lipid panel - MRI brain with and without contrast when stabilized to evaluate for underlying mass -  Risk factor modification - Echocardiogram - PT consult, OT consult, Speech consult. - Stroke team to follow - CT Chest, abd, pelvis to evaluate for underlying malignancy.  ______________________________________________________________________  This patient is critically ill and at significant risk of neurological worsening, death and care requires constant monitoring of vital signs, hemodynamics,respiratory and cardiac monitoring, neurological assessment, discussion with family, other specialists and medical decision making of high complexity. I spent 40 minutes of neurocritical care time  in the care of  this patient. This was time spent independent of any time provided by nurse practitioner or PA.  Erick Blinks [facility] 11/13/2023  2:31 AM  Signed,  Erick Blinks, MD [facility]

## 2023-11-13 NOTE — Evaluation (Signed)
 Speech Language Pathology Evaluation Patient Details Name: AYDON SWAMY MRN: [id number] DOB: August 23, 1951 Today's Date: 11/13/2023 Time:  -     Problem List:  Patient Active Problem List   Diagnosis Date Noted   ICH (intracerebral hemorrhage) (HCC) 11/12/2023   Cerebrovascular accident (CVA) (HCC) 01/16/2021   Past Medical History:  Past Medical History:  Diagnosis Date   Hearing loss    Hypertension    Past Surgical History:  Past Surgical History:  Procedure Laterality Date   APPENDECTOMY     CATARACT EXTRACTION, BILATERAL     COLONOSCOPY  2009   DENTAL SURGERY     NASAL SEPTUM SURGERY     HPI:  The pt is a 73 y.o. male who presents 3/12 with right hemianopsia and blurred vision. CT head and CTA demonstrated acute appearing left occipital IPH. Small chronic infarct left corona radiata. PMH of hypertension, hyperlipidemia, prior history of stroke.   Assessment / Plan / Recommendation Clinical Impression  Mr. Novinger participated in speech/language assessment. Communication was WNL. He demonstrated normal naming, fluency, repetition, and comprehension.  He was able to write to dictation and generate appropriate sentences.  Oral reading was marked by slow, deliberate output given difficulty scanning to the right to anticipate upcoming words. Clock-drawing task was WNL. Basic cognition - recognition of deficits, task attention, and working memory - were WNL. No SLP f /u is needed. Visual issues were addressed by OT.  Our service will sign off.    SLP Assessment  SLP Recommendation/Assessment: Patient does not need any further Speech Lanaguage Pathology Services SLP Visit Diagnosis: Cognitive communication deficit (R41.841)    Recommendations for follow up therapy are one component of a multi-disciplinary discharge planning process, led by the attending physician.  Recommendations may be updated based on patient status, additional functional criteria and insurance authorization.     Follow Up Recommendations  No SLP follow up    Assistance Recommended at Discharge  Other (comment) initial intermittent assistance                 SLP Evaluation Cognition  Overall Cognitive Status: Within Functional Limits for tasks assessed Arousal/Alertness: Awake/alert Orientation Level: Oriented X4 Attention: Alternating Alternating Attention: Appears intact Memory: Appears intact Awareness: Appears intact       Comprehension  Auditory Comprehension Overall Auditory Comprehension: Appears within functional limits for tasks assessed Yes/No Questions: Within Functional Limits Commands: Within Functional Limits Conversation: Complex Visual Recognition/Discrimination Discrimination: Within Function Limits Reading Comprehension Reading Status: Impaired Sentence Level: Impaired Paragraph Level: Impaired Interfering Components: Visual scanning;Visual perceptual    Expression Expression Primary Mode of Expression: Verbal Verbal Expression Overall Verbal Expression: Appears within functional limits for tasks assessed Initiation: No impairment Automatic Speech: Name;Social Response;Counting Level of Generative/Spontaneous Verbalization: Conversation Repetition: No impairment Naming: No impairment Pragmatics: No impairment Written Expression Dominant Hand: Right Written Expression: Within Functional Limits   Oral / Motor  Oral Motor/Sensory Function Overall Oral Motor/Sensory Function: Within functional limits Motor Speech Overall Motor Speech: Appears within functional limits for tasks assessed            Blenda Mounts Laurice 11/13/2023, 2:07 PM Vaida Kerchner L. Samson Frederic, MA CCC/SLP Clinical Specialist - Acute Care SLP Acute Rehabilitation Services Office number [phone]

## 2023-11-13 NOTE — Evaluation (Signed)
 Physical Therapy Evaluation Patient Details Name: Seth Gonzalez MRN: [id number] DOB: February 09, 1951 Today's Date: 11/13/2023  History of Present Illness  The pt is a 73 y.o. male who presents 3/12 with right hemianopsia and blurred vision. CT head and CTA demonstrated acute appearing left occipital IPH. PMH of hypertension, hyperlipidemia, prior history of stroke.  Clinical Impression  Pt in bed upon arrival of PT, agreeable to evaluation at this time. Prior to admission the pt was independent with all mobility, living alone, exercising regularly and with no recent falls or mobility issues. The pt now presents with limitations in functional mobility, dynamic balance, safety awareness, and memory due to above dx, and will continue to benefit from skilled PT to address these deficits. The pt was able to use visual scanning well while mobilizing to navigate around hallway around obstacles, however he did have multiple scissoring steps and minor LOB needing minA/CGA to correct. He was most challenged with head movements, changes in direction, and wayfinding. He was able to navigate stairs without LOB, but is dependent on UE support. Will continue to benefit from skilled PT acutely to progress dynamic balance and awareness, anticipate he will be able to return home with intermittent support from friends/family with continued skilled PT to further address high-level balance deficits.         If plan is discharge home, recommend the following: A little help with walking and/or transfers;Assist for transportation   Can travel by private vehicle        Equipment Recommendations None recommended by PT  Recommendations for Other Services       Functional Status Assessment Patient has had a recent decline in their functional status and demonstrates the ability to make significant improvements in function in a reasonable and predictable amount of time.     Precautions / Restrictions Precautions Precautions:  Fall Recall of Precautions/Restrictions: Intact Precaution/Restrictions Comments: right hemianopsia Restrictions Weight Bearing Restrictions Per Provider Order: No      Mobility  Bed Mobility Overal bed mobility: Independent             General bed mobility comments: PT managed lines only    Transfers Overall transfer level: Needs assistance Equipment used: None Transfers: Sit to/from Stand, Bed to chair/wheelchair/BSC Sit to Stand: Contact guard assist   Step pivot transfers: Contact guard assist       General transfer comment: CGA for safety, pt able to complete without LOB    Ambulation/Gait Ambulation/Gait assistance: Contact guard assist, Min assist Gait Distance (Feet): 400 Feet Assistive device: None Gait Pattern/deviations: Step-through pattern, Scissoring, Drifts right/left, Staggering left Gait velocity: WFL Gait velocity interpretation: 1.31 - 2.62 ft/sec, indicative of limited community ambulator   General Gait Details: pt with x3 episodes of scissoring and x3 minor LOB needing minA  to steady but pt making good reactive steps to regain balance. Pt reports he is unsure if this has been happening at home. VSS with mobility  Stairs Stairs: Yes Stairs assistance: Contact guard assist Stair Management: One rail Left, Alternating pattern Number of Stairs: 4 General stair comments: no overt LOB, light use of rail, then wall  Wheelchair Mobility     Tilt Bed    Modified Rankin (Stroke Patients Only) Modified Rankin (Stroke Patients Only) Pre-Morbid Rankin Score: No symptoms Modified Rankin: Moderately severe disability     Balance Overall balance assessment: Needs assistance Sitting-balance support: No upper extremity supported, Feet supported Sitting balance-Leahy Scale: Good     Standing balance support: No upper  extremity supported, During functional activity Standing balance-Leahy Scale: Fair Standing balance comment: LOB with head turns  and when attempting to stand tandem or unilaterally.             High level balance activites: Backward walking, Direction changes, Turns, Sudden stops, Other (comment) High Level Balance Comments: increased assist with challenge Standardized Balance Assessment Standardized Balance Assessment : Dynamic Gait Index   Dynamic Gait Index Level Surface: Normal Change in Gait Speed: Normal Gait with Horizontal Head Turns: Mild Impairment Gait with Vertical Head Turns: Mild Impairment Gait and Pivot Turn: Mild Impairment Step Over Obstacle: Mild Impairment Step Around Obstacles: Mild Impairment Steps: Mild Impairment Total Score: 18       Pertinent Vitals/Pain Pain Assessment Pain Assessment: No/denies pain    Home Living Family/patient expects to be discharged to:: Private residence Living Arrangements: Alone Available Help at Discharge: Friend(s);Available PRN/intermittently Type of Home: House Home Access: Stairs to enter Entrance Stairs-Rails: None Entrance Stairs-Number of Steps: 3-4 Alternate Level Stairs-Number of Steps: flight Home Layout: Multi-level;Able to live on main level with bedroom/bathroom Home Equipment: Gilmer Mor - single point;Shower seat      Prior Function Prior Level of Function : Independent/Modified Independent;Driving             Mobility Comments: no DME, no falls, walks for exercise or stationary bike ADLs Comments: independent     Extremity/Trunk Assessment   Upper Extremity Assessment Upper Extremity Assessment: Defer to OT evaluation    Lower Extremity Assessment Lower Extremity Assessment: Overall WFL for tasks assessed    Cervical / Trunk Assessment Cervical / Trunk Assessment: Normal  Communication   Communication Communication: No apparent difficulties    Cognition Arousal: Alert Behavior During Therapy: WFL for tasks assessed/performed   PT - Cognitive impairments: Awareness, Memory, Safety/Judgement                        PT - Cognition Comments: decreased insight to deficits, no memory of room number, difficulty finding way back to his room Following commands: Intact       Cueing Cueing Techniques: Verbal cues     General Comments General comments (skin integrity, edema, etc.): VSS on RA, BP within range        Assessment/Plan    PT Assessment Patient needs continued PT services  PT Problem List Decreased balance;Decreased mobility       PT Treatment Interventions DME instruction;Gait training;Stair training;Functional mobility training;Therapeutic activities;Therapeutic exercise;Balance training;Patient/family education    PT Goals (Current goals can be found in the Care Plan section)  Acute Rehab PT Goals Patient Stated Goal: retrun home PT Goal Formulation: With patient Time For Goal Achievement: 11/27/23 Potential to Achieve Goals: Good Additional Goals Additional Goal #1: Pt will score >19/24 on DGI to indicate reduced risk of falls    Frequency Min 3X/week        AM-PAC PT "6 Clicks" Mobility  Outcome Measure Help needed turning from your back to your side while in a flat bed without using bedrails?: None Help needed moving from lying on your back to sitting on the side of a flat bed without using bedrails?: None Help needed moving to and from a bed to a chair (including a wheelchair)?: None Help needed standing up from a chair using your arms (e.g., wheelchair or bedside chair)?: A Little Help needed to walk in hospital room?: A Little Help needed climbing 3-5 steps with a railing? : A Little 6 Click Score:  21    End of Session Equipment Utilized During Treatment: Gait belt Activity Tolerance: Patient tolerated treatment well Patient left: in chair;with call bell/phone within reach;with chair alarm set Nurse Communication: Mobility status PT Visit Diagnosis: Unsteadiness on feet (R26.81);Other abnormalities of gait and mobility (R26.89)    Time: 4540-9811 PT  Time Calculation (min) (ACUTE ONLY): 29 min   Charges:   PT Evaluation $PT Eval Moderate Complexity: 1 Mod PT Treatments $Neuromuscular Re-education: 8-22 mins PT General Charges $$ ACUTE PT VISIT: 1 Visit         Vickki Muff, PT, DPT   Acute Rehabilitation Department Office [phone] Secure Chat Communication Preferred  Ronnie Derby 11/13/2023, 12:21 PM

## 2023-11-13 NOTE — Evaluation (Signed)
 Occupational Therapy Evaluation Patient Details Name: Seth Gonzalez MRN: [id number] DOB: 03/14/1951 Today's Date: 11/13/2023   History of Present Illness   Seth Gonzalez is a 73 y.o. male with who presents with right hemianopsia and blurred vision. CT head and CTA demonstrated acute appearing left occipital IPH. PMH of hypertension, hyperlipidemia, prior history of stroke.     Clinical Impressions Pt currently at min guard to supervision level for selfcare tasks and functional transfers/ mobility without assistive device.  Occasional LOB noted with mobility when having to complete head turns for locating objects that therapist stated.  He would incorporate slower stepping strategy but did not require extensive assist to regain balance.  Exhibits right hemianopsia with awareness of deficit and being able to exhibit some compensatory strategies of eye and head turns to the right.  Pt lives alone and has no consistent assistance.  Feel he will benefit from acute care OT at this time in order to help increase dynamic balance for safer completion of ADLs and home management as well as provide compensation strategies for visual field deficit.  Recommend HHOT eval for safety in familiar environment once discharged home.       If plan is discharge home, recommend the following:   Assist for transportation     Functional Status Assessment   Patient has had a recent decline in their functional status and demonstrates the ability to make significant improvements in function in a reasonable and predictable amount of time.     Equipment Recommendations   None recommended by OT      Precautions/Restrictions   Precautions Precautions: Fall Precaution/Restrictions Comments: right hemianopsia Restrictions Weight Bearing Restrictions Per Provider Order: No     Mobility Bed Mobility               General bed mobility comments: Pt up in chair to start session    Transfers Overall  transfer level: Needs assistance   Transfers: Sit to/from Stand, Bed to chair/wheelchair/BSC Sit to Stand: Supervision     Step pivot transfers: Supervision     General transfer comment: Min guard for mobility without assistive device.  Occasional LOB noted with head turns but able to regain with min guard assist.      Balance Overall balance assessment: Needs assistance Sitting-balance support: No upper extremity supported, Feet supported Sitting balance-Leahy Scale: Good     Standing balance support: During functional activity Standing balance-Leahy Scale: Fair Standing balance comment: LOB with head turns and when attempting to stand tandem or unilaterally.                           ADL either performed or assessed with clinical judgement   ADL Overall ADL's : Needs assistance/impaired Eating/Feeding: Independent;Sitting   Grooming: Wash/dry face;Wash/dry hands;Supervision/safety;Standing   Upper Body Bathing: Set up;Sitting   Lower Body Bathing: Supervison/ safety;Sit to/from stand   Upper Body Dressing : Set up;Sitting   Lower Body Dressing: Supervision/safety;Sit to/from stand   Toilet Transfer: Supervision/safety;Ambulation;Regular Toilet   Toileting- Architect and Hygiene: Supervision/safety;Sit to/from stand   Tub/ Shower Transfer: Supervision/safety;Ambulation   Functional mobility during ADLs: Contact guard assist (no assistive device) General ADL Comments: Pt with occasional LOB for which he was able to regain on his own during mobilty and head turns when looking for objects in the hallway.  Provided education on using circular pattern for scanning environment with mobility.  Needs further education on reading techniques as he  reports only seeing one word at a time to the right.  Recommend shower seat for home, which patient thinks he may already have.     Vision Baseline Vision/History: 1 Wears glasses (reading only) Ability to See  in Adequate Light: 1 Impaired Patient Visual Report: Blurring of vision;Peripheral vision impairment Vision Assessment?: Yes Eye Alignment: Within Functional Limits Ocular Range of Motion: Within Functional Limits Alignment/Gaze Preference: Within Defined Limits Tracking/Visual Pursuits: Able to track stimulus in all quads without difficulty Convergence: Within functional limits Visual Fields: Right homonymous hemianopsia Diplopia Assessment: Other (comment) (Pt reports occasional diplopia when reading where there would be two "Os" in the word "to" for example but not present this session.)     Perception Perception: Within Functional Limits       Praxis Praxis: [facility]       Pertinent Vitals/Pain Pain Assessment Pain Assessment: No/denies pain     Extremity/Trunk Assessment Upper Extremity Assessment Upper Extremity Assessment: Overall WFL for tasks assessed   Lower Extremity Assessment Lower Extremity Assessment: Defer to PT evaluation   Cervical / Trunk Assessment Cervical / Trunk Assessment: Normal   Communication Communication Communication: No apparent difficulties   Cognition Arousal: Alert Behavior During Therapy: WFL for tasks assessed/performed Cognition: No apparent impairments                               Following commands: Intact                  Home Living Family/patient expects to be discharged to:: Private residence Living Arrangements: Alone Available Help at Discharge: Friend(s);Available PRN/intermittently Type of Home: House Home Access: Stairs to enter [employer] of Steps: 3-4 Entrance Stairs-Rails: None Home Layout: Multi-level;Able to live on main level with bedroom/bathroom Alternate Level Stairs-Number of Steps: flight Alternate Level Stairs-Rails: Right Bathroom Shower/Tub: Producer, television/film/video: Standard Bathroom Accessibility: No   Home Equipment: Cane - single point;Shower seat           Prior Functioning/Environment Prior Level of Function : Independent/Modified Independent;Driving             Mobility Comments: no DME, no falls, walks for exercise or stationary bike ADLs Comments: independent    OT Problem List: Impaired vision/perception;Impaired balance (sitting and/or standing);Decreased knowledge of use of DME or AE   OT Treatment/Interventions: Self-care/ADL training;Visual/perceptual remediation/compensation;Patient/family education;Neuromuscular education;DME and/or AE instruction;Therapeutic activities      OT Goals(Current goals can be found in the care plan section)   Acute Rehab OT Goals Patient Stated Goal: Pt wants to get his vision better. OT Goal Formulation: With patient Time For Goal Achievement: 11/27/23 Potential to Achieve Goals: Good   OT Frequency:  Min 2X/week       AM-PAC OT "6 Clicks" Daily Activity     Outcome Measure Help from another person eating meals?: None Help from another person taking care of personal grooming?: A Little Help from another person toileting, which includes using toliet, bedpan, or urinal?: A Little Help from another person bathing (including washing, rinsing, drying)?: A Little Help from another person to put on and taking off regular upper body clothing?: A Little Help from another person to put on and taking off regular lower body clothing?: A Little 6 Click Score: 19   End of Session Equipment Utilized During Treatment: Gait belt Nurse Communication: Mobility status  Activity Tolerance: Patient tolerated treatment well Patient left: in chair;with call bell/phone  within reach;with chair alarm set  OT Visit Diagnosis: Unsteadiness on feet (R26.81);Other (comment) (visual disturbance)                Time: 1110-1143 OT Time Calculation (min): 33 min Charges:  OT General Charges $OT Visit: 1 Visit OT Evaluation $OT Eval Moderate Complexity: 1 Mod OT Treatments $Self Care/Home Management :  8-22 mins  Perrin Maltese, OTR/L Acute Rehabilitation Services  Office [phone] 11/13/2023

## 2023-11-13 NOTE — Progress Notes (Addendum)
 STROKE TEAM PROGRESS NOTE   INTERIM HISTORY/SUBJECTIVE Seen in ICU, CT CAP pending.  Still has a right hemianopsia. Hemodynamically stable- resume home BP meds, stop cleviprex. Plan to transfer out of ICU later today. Will order a 30 day monitor at discharge MRI scan and CT scan shows stable appearance of parenchymal left occipital hematoma with mild cytotoxic edema with no significant intraventricular extension or hydrocephalus.  CT angiogram shows no large vessel occlusion or aneurysm OBJECTIVE  CBC    Component Value Date/Time   WBC 10.9 (H) 11/12/2023 1806   RBC 4.49 11/12/2023 1806   HGB 13.6 11/12/2023 1806   HCT 40.9 11/12/2023 1806   PLT 288 11/12/2023 1806   MCV 91.1 11/12/2023 1806   MCH 30.3 11/12/2023 1806   MCHC 33.3 11/12/2023 1806   RDW 12.2 11/12/2023 1806   LYMPHSABS 1.8 11/12/2023 1806   MONOABS 1.0 11/12/2023 1806   EOSABS 0.1 11/12/2023 1806   BASOSABS 0.1 11/12/2023 1806    BMET    Component Value Date/Time   NA 135 11/12/2023 1806   K 3.6 11/12/2023 1806   CL 104 11/12/2023 1806   CO2 23 11/12/2023 1806   GLUCOSE 122 (H) 11/12/2023 1806   BUN 12 11/12/2023 1806   CREATININE 0.94 11/12/2023 1806   CALCIUM 8.9 11/12/2023 1806   GFRNONAA >60 11/12/2023 1806    IMAGING past 24 hours CT ANGIO HEAD W OR WO CONTRAST Result Date: 11/12/2023 CLINICAL DATA:  Intracranial hemorrhage EXAM: CT ANGIOGRAPHY HEAD TECHNIQUE: Multidetector CT imaging of the head was performed using the standard protocol during bolus administration of intravenous contrast. Multiplanar CT image reconstructions and MIPs were obtained to evaluate the vascular anatomy. RADIATION DOSE REDUCTION: This exam was performed according to the departmental dose-optimization program which includes automated exposure control, adjustment of the mA and/or kV according to patient size and/or use of iterative reconstruction technique. CONTRAST:  75mL OMNIPAQUE IOHEXOL 350 MG/ML SOLN COMPARISON:  11/12/2022  brain MRI FINDINGS: CT HEAD Brain: Left occipital intraparenchymal hematoma measuring 2.3 x 1.9 cm moderate surrounding edema. Small amount of parafalcine subdural blood. Vascular: No hyperdense vessel or unexpected vascular calcification. Skull: The visualized skull base, calvarium and extracranial soft tissues are normal. Sinuses/Orbits: No fluid levels or advanced mucosal thickening of the visualized paranasal sinuses. No mastoid or middle ear effusion. Normal orbits. Other: None. CTA HEAD POSTERIOR CIRCULATION: Hypoplastic left vertebral artery likely terminates in PICA. Normal right V4 segment. No proximal occlusion of the anterior or inferior cerebellar arteries. Basilar artery is normal. Superior cerebellar arteries are normal. Posterior cerebral arteries are normal. ANTERIOR CIRCULATION: Intracranial internal carotid arteries are normal. Anterior cerebral arteries are normal. Middle cerebral arteries are normal. Anatomic Variants: Bilateral fetal predominant origins of the posterior cerebral arteries. Review of the MIP images confirms the above findings. IMPRESSION: 1. Left occipital intraparenchymal hematoma measuring 2.3 x 1.9 cm with moderate surrounding edema. 2. Small amount of parafalcine subdural blood. 3. No vascular abnormality of the intracranial arteries. Electronically Signed   By: Deatra Robinson M.D.   On: 11/12/2023 20:27   MR BRAIN W WO CONTRAST Result Date: 11/12/2023 CLINICAL DATA:  Right-sided peripheral vision loss for 3 weeks. Headache. History of stroke. EXAM: MRI HEAD WITHOUT AND WITH CONTRAST TECHNIQUE: Multiplanar, multiecho pulse sequences of the brain and surrounding structures were obtained without and with intravenous contrast. CONTRAST:  8mL GADAVIST GADOBUTROL 1 MMOL/ML IV SOLN COMPARISON:  Head MRI 01/10/2021 FINDINGS: Brain: There is an approximately 4.5-5 cm hemorrhage in the medial left  occipital lobe which is partially intrinsically T1 hyperintense and may be subacute.  There is moderate surrounding edema with sulcal effacement and partial effacement of the left lateral ventricle. There is heterogeneous enhancement along the periphery of the hemorrhage of up to moderate thickness, and there is also separate, more confluent enhancement immediately inferior to the hemorrhage in the left occipital lobe which spans 3 cm AP by 2 cm transverse. Mild diffusion weighted signal abnormality along the margins of the hemorrhage demonstrates at most mildly reduced corresponding ADC. No enhancing intracranial lesion is identified elsewhere. No significant chronic hemorrhage is evident elsewhere to suggest underlying cerebral amyloid angiopathy. A small subdural hematoma over the left cerebral convexity measures up to 3 mm in thickness with associated smooth dural enhancement. There is no more than trace rightward midline shift. Patchy T2 hyperintensities elsewhere in the cerebral white matter bilaterally are similar to the prior MRI and are nonspecific but compatible with moderate chronic small vessel ischemic disease. A chronic lacunar infarct in the left corona radiata was acute on the prior MRI. Dedicated imaging was performed through the sella turcica. The pituitary gland is normal in size and enhances homogeneously. No sellar or suprasellar lesion is identified. The pituitary stalk insert slightly left of midline. The optic chiasm and cavernous sinuses are unremarkable. Vascular: Major intracranial vascular flow voids are preserved. The dural venous sinuses are enhancing. Skull and upper cervical spine: Unremarkable bone marrow signal. Sinuses/Orbits: Bilateral cataract extraction. Mild mucosal thickening in the right maxillary sinus. Clear mastoid air cells. Other: None. Critical Value/emergent results were called by telephone at the time of interpretation on 11/12/2023 at 4:50 pm to provider [facility], who verbally acknowledged these results. IMPRESSION: 1. Left occipital parenchymal  hemorrhage with moderate surrounding edema and associated enhancement. This is of indeterminate etiology with considerations including underlying primary or metastatic tumor, hemorrhagic infarct, or vascular malformation. 2. Small subdural hematoma over the left cerebral convexity, presumably reflecting extension of the left occipital hemorrhage. At most trace rightward midline shift. 3. Moderate chronic small vessel ischemic disease. Electronically Signed   By: Sebastian Ache M.D.   On: 11/12/2023 16:52    Vitals:   11/13/23 0730 11/13/23 0745 11/13/23 0800 11/13/23 0815  BP: 128/77 130/79 139/76 (!) 141/74  Pulse: 88 85 93 93  Resp: 16 13 20 16   Temp:   98.2 F (36.8 C)   TempSrc:   Oral   SpO2: 92% 93% 93% 91%  Weight:      Height:         PHYSICAL EXAM General:  Alert, well-nourished, well-developed pleasant elderly male in no acute distress Psych:  Mood and affect appropriate for situation CV: Regular rate and rhythm on monitor Respiratory:  Regular, unlabored respirations on room air GI: Abdomen soft and nontender   NEURO:  Mental Status: AA&Ox3, patient is able to give clear and coherent history Speech/Language: speech is without dysarthria or aphasia.  Naming, repetition, fluency, and comprehension intact.  Diminished recall 1/3.  Cranial Nerves:  II: PERRL. Right dense homonymous hemianopsia III, IV, VI: EOMI. Eyelids elevate symmetrically.  V: Sensation is intact to light touch and symmetrical to face.  VII: Face is symmetrical resting and smiling VIII: hearing intact to voice. IX, X: Palate elevates symmetrically. Phonation is normal.  WU:[id number] shrug 5/5. XII: tongue is midline without fasciculations. Motor: 5/5 strength to all muscle groups tested.  Tone: is normal and bulk is normal Sensation- Intact to light touch bilaterally. Extinction absent to light touch to  DSS.   Coordination: FTN intact bilaterally, HKS: no ataxia in BLE.No drift.  Gait-  deferred  Most Recent NIH 3     ASSESSMENT/PLAN  Seth Gonzalez is a 73 y.o. male with history of hypertension, hyperlipidemia, erectile dysfunction, prior history of stroke who presents with right hemianopsia and blurred vision. NIH on Admission 3  Intraparenchymal Hemorrhage:  left occipital intraparenchymal hematoma  Etiology:  Hemorrhagic infarct vs primary hemorrhage with underlying mass given subacute progressive symptoms over 2 to 3 weeks CTA head & neck No vascular abnormality MRI  Left occipital parenchymal hemorrhage with moderate surrounding edema and associated enhancement. This is of indeterminate etiology with considerations including underlying primary or metastatic tumor, hemorrhagic infarct, or vascular malformation. Small subdural hematoma over the left cerebral convexity, presumably reflecting extension of the left occipital hemorrhage. At most trace rightward midline shift. Moderate chronic small vessel ischemic disease. Follow up CT Head- Unchanged left occipital hematoma with vasogenic edema.  2D Echo Pending  CT CAP - pending  LDL 39 HgbA1c 5.9 VTE prophylaxis - SCDs clopidogrel 75 mg daily prior to admission, now on No antithrombotic  Therapy recommendations:  Home Health PT and Home Health OT Disposition:  Pending  Hx of stroke Stroke in 2022- acute stroke involving left lenticular capsular region, moderate supratentorium small vessel disease   Follows with Dr. Terrace Arabia- on plavix  Hypertension Home meds:  Norvasc, Telmisartan Stable Blood Pressure Goal: SBP 120-160 for first 24 hours then less than 180   Hyperlipidemia Home meds:  Crestor, resumed in hospital LDL 39, goal < 70 Resume statin at discharge  Other Stroke Risk Factors Obesity, Body mass index is 24.96 kg/m., BMI >/= 30 associated with increased stroke risk, recommend weight loss, diet and exercise as appropriate   Other Active Problems   Hospital day # 1  Patient seen and examined by  NP/APP with MD. MD to update note as needed.   Elmer Picker, DNP, FNP-BC [facility] Pager: [phone]  I have personally obtained history,examined this patient, reviewed notes, independently viewed imaging studies, participated in medical decision making and plan of care.ROS completed by me personally and pertinent positives fully documented  I have made any additions or clarifications directly to the above note. Agree with note above.  Patient presented with subacute symptoms for a few weeks of right-sided visual disturbance followed by sudden worsening days ago and found to have right homonymous hemianopsia prompting brain imaging which shows left parietal parenchymal hematoma of indeterminate etiology.  Given subacute and progressive nature of his symptoms underlying mass lesion remains a possibility and patient may need repeat brain imaging a few months.  Continue ongoing stroke workup.  Strict blood pressure control with systolic goal below 160.  Start p.o. blood pressure medications and use as needed medications and wean off Cleviprex drip as tolerated.  Mobilize out of bed.  Therapy consults.  Patient counseled not to drive peripheral vision loss improves. This patient is critically ill and at significant risk of neurological worsening, death and care requires constant monitoring of vital signs, hemodynamics,respiratory and cardiac monitoring, extensive review of multiple databases, frequent neurological assessment, discussion with family, other specialists and medical decision making of high complexity.I have made any additions or clarifications directly to the above note.This critical care time does not reflect procedure time, or teaching time or supervisory time of PA/NP/Med Resident etc but could involve care discussion time.  I spent 30 minutes of neurocritical care time  in the care of  this patient.     Delia Heady, MD Medical Director [facility] Pager:  [phone] 11/13/2023 3:59 PM   To contact Stroke Continuity provider, please refer to [email or web address]. After hours, contact General Neurology

## 2023-11-13 NOTE — Progress Notes (Signed)
 Seth Gonzalez was transferred to 4NP01 via wheelchair, all belongings with patient. Nate, RN at bedside upon transfer, all questions answered. Seth Gonzalez was able to inform his sister of the room change.

## 2023-11-13 NOTE — Plan of Care (Signed)
  Problem: Education: Goal: Knowledge of General Education information will improve Description: Including pain rating scale, medication(s)/side effects and non-pharmacologic comfort measures Outcome: Progressing   Problem: Health Behavior/Discharge Planning: Goal: Ability to manage health-related needs will improve Outcome: Progressing   Problem: Clinical Measurements: Goal: Ability to maintain clinical measurements within normal limits will improve Outcome: Progressing Goal: Will remain free from infection Outcome: Progressing   Problem: Nutrition: Goal: Adequate nutrition will be maintained Outcome: Completed/Met   Problem: Clinical Measurements: Goal: Respiratory complications will improve Outcome: Not Applicable   Problem: Coping: Goal: Level of anxiety will decrease Outcome: Not Applicable

## 2023-11-14 DIAGNOSIS — I611 Nontraumatic intracerebral hemorrhage in hemisphere, cortical: Secondary | ICD-10-CM | POA: Diagnosis not present

## 2023-11-14 DIAGNOSIS — R29703 NIHSS score 3: Secondary | ICD-10-CM | POA: Diagnosis not present

## 2023-11-14 DIAGNOSIS — I10 Essential (primary) hypertension: Secondary | ICD-10-CM | POA: Diagnosis not present

## 2023-11-14 NOTE — Progress Notes (Signed)
 Physical Therapy Treatment Patient Details Name: Seth Gonzalez MRN: [id number] DOB: 12-Sep-1950 Today's Date: 11/14/2023   History of Present Illness The pt is a 73 y.o. male who presents 3/12 with right hemianopsia and blurred vision. CT head and CTA demonstrated acute appearing left occipital IPH. PMH of hypertension, hyperlipidemia, prior history of stroke.    PT Comments  The pt was seen for continued mobility and dynamic balance training. He demos great improvement in mobility from prior session, and completed 500 ft without need for UE support or assist and had no episodes of scissoring or LOB even with balance challenge. The pt reports feeling much closer to his baseline. Discussed gradual return to mobility and exercise as well a general safety recommendations for home. Pt in agreement, safe to return home when medically cleared.   Dynamic Gait Index (DGI): 21/24 (<19 indicates increased risk for falls)   If plan is discharge home, recommend the following: A little help with walking and/or transfers;Assist for transportation   Can travel by private vehicle        Equipment Recommendations  None recommended by PT    Recommendations for Other Services       Precautions / Restrictions Precautions Precautions: Fall Recall of Precautions/Restrictions: Intact Precaution/Restrictions Comments: right hemianopsia Restrictions Weight Bearing Restrictions Per Provider Order: No     Mobility  Bed Mobility               General bed mobility comments: PT OOB in recliner    Transfers Overall transfer level: Independent Equipment used: None Transfers: Sit to/from Stand, Bed to chair/wheelchair/BSC Sit to Stand: Supervision, Independent   Step pivot transfers: Supervision       General transfer comment: supervision in session, pt completing independently at end of session    Ambulation/Gait Ambulation/Gait assistance: Contact guard assist, Supervision Gait Distance  (Feet): 500 Feet Assistive device: None Gait Pattern/deviations: Step-through pattern Gait velocity: WFL     General Gait Details: improved balance, even with additional challenge   Stairs Stairs: Yes Stairs assistance: Supervision Stair Management: Alternating pattern, One rail Right, Forwards Number of Stairs: 12 General stair comments: stable   Wheelchair Mobility     Tilt Bed    Modified Rankin (Stroke Patients Only) Modified Rankin (Stroke Patients Only) Pre-Morbid Rankin Score: No symptoms Modified Rankin: Moderate disability     Balance Overall balance assessment: Needs assistance Sitting-balance support: No upper extremity supported, Feet supported Sitting balance-Leahy Scale: Good     Standing balance support: No upper extremity supported, During functional activity Standing balance-Leahy Scale: Good Standing balance comment: no LOB with balance challenge     Tandem Stance - Right Leg: 5 Tandem Stance - Left Leg: 5 Rhomberg - Eyes Opened: 15 Rhomberg - Eyes Closed: 10 High level balance activites: Side stepping, Direction changes, Backward walking, Turns, Sudden stops, Head turns High Level Balance Comments: some increased sway, no overt LOB Standardized Balance Assessment Standardized Balance Assessment : Dynamic Gait Index   Dynamic Gait Index Level Surface: Normal Change in Gait Speed: Normal Gait with Horizontal Head Turns: Normal Gait with Vertical Head Turns: Mild Impairment Gait and Pivot Turn: Mild Impairment Step Over Obstacle: Normal Step Around Obstacles: Normal Steps: Mild Impairment Total Score: 21      Communication Communication Communication: No apparent difficulties  Cognition Arousal: Alert Behavior During Therapy: WFL for tasks assessed/performed   PT - Cognitive impairments: No apparent impairments  PT - Cognition Comments: improved from yesterday Following commands: Intact       Cueing Cueing Techniques: Verbal cues  Exercises      General Comments General comments (skin integrity, edema, etc.): VSS      Pertinent Vitals/Pain Pain Assessment Pain Assessment: No/denies pain     PT Goals (current goals can now be found in the care plan section) Acute Rehab PT Goals Patient Stated Goal: retrun home PT Goal Formulation: With patient Time For Goal Achievement: 11/27/23 Potential to Achieve Goals: Good Progress towards PT goals: Progressing toward goals    Frequency    Min 3X/week       AM-PAC PT "6 Clicks" Mobility   Outcome Measure  Help needed turning from your back to your side while in a flat bed without using bedrails?: None Help needed moving from lying on your back to sitting on the side of a flat bed without using bedrails?: None Help needed moving to and from a bed to a chair (including a wheelchair)?: None Help needed standing up from a chair using your arms (e.g., wheelchair or bedside chair)?: None Help needed to walk in hospital room?: None Help needed climbing 3-5 steps with a railing? : A Little 6 Click Score: 23    End of Session Equipment Utilized During Treatment: Gait belt Activity Tolerance: Patient tolerated treatment well Patient left: in chair;with call bell/phone within reach Nurse Communication: Mobility status PT Visit Diagnosis: Unsteadiness on feet (R26.81);Other abnormalities of gait and mobility (R26.89)     Time: 1610-9604 PT Time Calculation (min) (ACUTE ONLY): 16 min  Charges:    $Gait Training: 8-22 mins PT General Charges $$ ACUTE PT VISIT: 1 Visit                     Vickki Muff, PT, DPT   Acute Rehabilitation Department Office [phone] Secure Chat Communication Preferred   Ronnie Derby 11/14/2023, 12:32 PM

## 2023-11-14 NOTE — Plan of Care (Signed)
 Reviewed s/s of stroke and numbers to call for follow-up. Reviewed discharge medication and follow-up care required. Patient was also instructed he was not allowed to drive until cleared by Neurology. Patient verbalized understanding as well as sister. Belongings gathered, discharged via WC to entrance A.

## 2023-11-14 NOTE — Discharge Instructions (Addendum)
 Mr. Seth Gonzalez, you came to the hospital with a headache and impaired vision on the right side.  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  You will need to stop taking your Plavix for now and discuss resuming it when you are seen in the stroke clinic. You will need to wear a 30 day cardiac monitor after discharge, and this will be sent to you in the mail. You will need to get a follow up MRI of your brain with and without contrast in 2-3 months.  Please follow up with outpatient PT and OT and do not drive unless your vision improves.

## 2023-11-14 NOTE — Progress Notes (Signed)
 Occupational Therapy Treatment Patient Details Name: Seth Gonzalez MRN: [id number] DOB: 06-24-1951 Today's Date: 11/14/2023   History of present illness The pt is a 73 y.o. male who presents 3/12 with right hemianopsia and blurred vision. CT head and CTA demonstrated acute appearing left occipital IPH. PMH of hypertension, hyperlipidemia, prior history of stroke.   OT comments  Pt is at adequate level for d/c home with family (A). Pt demonstrates improvements with visual scanning during locating home clothing. Pt is able to verbalize R visual field cut. Recommendations for home services are beneficial.        If plan is discharge home, recommend the following:  Assist for transportation   Equipment Recommendations  None recommended by OT    Recommendations for Other Services      Precautions / Restrictions Precautions Precautions: Fall Recall of Precautions/Restrictions: Intact Precaution/Restrictions Comments: right hemianopsia       Mobility Bed Mobility               General bed mobility comments: oob in chair    Transfers Overall transfer level: Independent                       Balance                                           ADL either performed or assessed with clinical judgement   ADL Overall ADL's : Needs assistance/impaired                 Upper Body Dressing : Modified independent   Lower Body Dressing: Modified independent   Toilet Transfer: Supervision/safety             General ADL Comments: pt educated on using compensatory strategies for vision. Pt educated on need to have discussion with MD regarding driving restrictions due to visual deficits. Pt educated on using an anchor page to scan documents for reading and pointing with finger when needed. pt educated on use of "siri" and voice to text to send messages more than one or two words    Extremity/Trunk Assessment              Vision        Perception     Praxis     Communication Communication Communication: No apparent difficulties   Cognition Arousal: Alert Behavior During Therapy: WFL for tasks assessed/performed Cognition: No apparent impairments                               Following commands: Intact        Cueing      Exercises      Shoulder Instructions       General Comments VSS    Pertinent Vitals/ Pain       Pain Assessment Pain Assessment: No/denies pain  Home Living   Living Arrangements: Alone                                      Prior Functioning/Environment              Frequency  Min 2X/week        Progress Toward Goals  OT Goals(current goals can now be  found in the care plan section)  Progress towards OT goals: Progressing toward goals  Acute Rehab OT Goals Patient Stated Goal: to go home Time For Goal Achievement: 11/27/23 Potential to Achieve Goals: Good ADL Goals Pt Will Perform Lower Body Bathing: with modified independence;sit to/from stand Pt Will Perform Lower Body Dressing: with modified independence;sit to/from stand Pt Will Transfer to Toilet: with modified independence;ambulating;regular height toilet Pt Will Perform Toileting - Clothing Manipulation and hygiene: with modified independence;sit to/from stand Pt Will Perform Tub/Shower Transfer: Shower transfer;with modified independence;shower seat;ambulating Additional ADL Goal #1: Pt will verbalize at least two visual compensation strategies for right hemianopsia with reading, self care tasks, and simple home management.  Plan      Co-evaluation                 AM-PAC OT "6 Clicks" Daily Activity     Outcome Measure   Help from another person eating meals?: None Help from another person taking care of personal grooming?: A Little Help from another person toileting, which includes using toliet, bedpan, or urinal?: A Little Help from another person bathing  (including washing, rinsing, drying)?: A Little Help from another person to put on and taking off regular upper body clothing?: A Little Help from another person to put on and taking off regular lower body clothing?: A Little 6 Click Score: 19    End of Session    OT Visit Diagnosis: Unsteadiness on feet (R26.81);Other (comment)   Activity Tolerance Patient tolerated treatment well   Patient Left in chair;with family/visitor present   Nurse Communication Mobility status        Time: 1240-1250 OT Time Calculation (min): 10 min  Charges: OT General Charges $OT Visit: 1 Visit OT Treatments $Self Care/Home Management : 8-22 mins   Brynn, OTR/L  Acute Rehabilitation Services Office: [phone] .   Mateo Flow 11/14/2023, 2:22 PM

## 2023-11-14 NOTE — Plan of Care (Signed)
 Seth Gonzalez continues to have blurred/absent peripheral vision on the right side. Neuro exam otherwise unremarkable. He understands safety precautions to take with his limited r eye vision. VSS, sister at bedside awaiting discharge orders. Will continue to monitor.

## 2023-11-14 NOTE — TOC Transition Note (Signed)
 Transition of Care (TOC) - Discharge Note Donn Pierini RN,BSN Transitions of Care Unit 4NP (Non Trauma)- RN Case Manager See Treatment Team for direct Phone #   Patient Details  Name: Seth Gonzalez MRN: [id number] Date of Birth: [birth year]  Transition of Care [facility]) CM/SW Contact:  Darrold Span, RN Phone Number: 11/14/2023, 1:52 PM   Clinical Narrative:    Pt stable for transition home today, Orders placed for HHPT/OT.  CM in to speak with pt at bedside- sister-n-law also present- pt voiced ok to speak in front of family.  Discussed HH orders and list provided for [facility] choice Per CMS guidelines from [employer] website with star ratings (copy placed in shadow chart). Per pt he states he does not have a preference and defers to this writer to secure one that will work with his insurance.  Sister-n-law to transport home.   Address, phone # and PCP all confirmed w/ pt in epic.  No DME needs noted.   CM placed call to [facility] for [facility] referral- referral has been accepted for HHPT/OT.   TOC needs have been completed for discharge.    Final next level of care: Home w Home Health Services Barriers to Discharge: No Barriers Identified   Patient Goals and CMS Choice Patient states their goals for this hospitalization and ongoing recovery are:: return home CMS Medicare.gov Compare Post Acute Care list provided to:: Patient Choice offered to / list presented to : Patient, Sibling (sister-in-law)      Discharge Placement                 Home w/ [facility]      Discharge Plan and Services Additional resources added to the After Visit Summary for     Discharge Planning Services: CM Consult Post Acute Care Choice: Home Health          DME Arranged: N/A DME Agency: NA       HH Arranged: PT, OT HH Agency: [facility] Date [facility] Agency Contacted: 11/14/23 Time HH Agency Contacted: 1351 Representative spoke with at [facility] Agency: Amy  Social Drivers of Health (SDOH)  Interventions SDOH Screenings   Food Insecurity: No Food Insecurity (11/13/2023)  Housing: Low Risk  (11/13/2023)  Transportation Needs: No Transportation Needs (11/13/2023)  Utilities: At Risk (11/13/2023)  Social Connections: Moderately Isolated (11/13/2023)  Tobacco Use: Low Risk  (11/12/2023)     Readmission Risk Interventions    11/14/2023    1:52 PM  Readmission Risk Prevention Plan  Post Dischage Appt Complete  Medication Screening Complete  Transportation Screening Complete

## 2023-11-14 NOTE — Discharge Summary (Addendum)
 Stroke Discharge Summary  Patient ID: Seth Gonzalez   MRN: [id number]      DOB: 06/25/1951  Date of Admission: 11/12/2023 Date of Discharge: 11/14/2023  Attending Physician:  Stroke, Md, MD Consultant(s):    None  Patient's PCP:  Tisovec, Adelfa Koh, MD  DISCHARGE PRIMARY DIAGNOSIS:  Intraparenchymal Hemorrhage:  left occipital intraparenchymal hematoma  Etiology: Indeterminate -hemorrhagic infarct vs primary hemorrhage with underlying mass given subacute progressive symptoms over 2 to 3 weeks  Patient Active Problem List   Diagnosis Date Noted   ICH (intracerebral hemorrhage) (HCC) 11/12/2023   Cerebrovascular accident (CVA) (HCC) 01/16/2021     Secondary Diagnoses: Hypertension Hyperlipidemia Cytotoxic edema Right homonymous hemianopsia   Allergies as of 11/14/2023   No Known Allergies      Medication List     STOP taking these medications    clopidogrel 75 MG tablet Commonly known as: PLAVIX   UNKNOWN TO PATIENT       TAKE these medications    amLODipine 5 MG tablet Commonly known as: NORVASC Take 5 mg by mouth at bedtime.   Ayr 0.65 % nasal spray Generic drug: sodium chloride Place 1 spray into the nose as needed for congestion.   loratadine 10 MG tablet Commonly known as: CLARITIN Take 10 mg by mouth daily as needed (for seasonal allergies).   multivitamin tablet Take 1 tablet by mouth 3 (three) times a week.   rosuvastatin 20 MG tablet Commonly known as: CRESTOR Take 20 mg by mouth daily.   sildenafil 20 MG tablet Commonly known as: REVATIO Take 100 mg by mouth daily as needed (for E.D.).   telmisartan 80 MG tablet Commonly known as: MICARDIS Take 80 mg by mouth daily.        LABORATORY STUDIES CBC    Component Value Date/Time   WBC 10.9 (H) 11/12/2023 1806   RBC 4.49 11/12/2023 1806   HGB 13.6 11/12/2023 1806   HCT 40.9 11/12/2023 1806   PLT 288 11/12/2023 1806   MCV 91.1 11/12/2023 1806   MCH 30.3 11/12/2023 1806   MCHC  33.3 11/12/2023 1806   RDW 12.2 11/12/2023 1806   LYMPHSABS 1.8 11/12/2023 1806   MONOABS 1.0 11/12/2023 1806   EOSABS 0.1 11/12/2023 1806   BASOSABS 0.1 11/12/2023 1806   CMP    Component Value Date/Time   NA 135 11/12/2023 1806   K 3.6 11/12/2023 1806   CL 104 11/12/2023 1806   CO2 23 11/12/2023 1806   GLUCOSE 122 (H) 11/12/2023 1806   BUN 12 11/12/2023 1806   CREATININE 0.94 11/12/2023 1806   CALCIUM 8.9 11/12/2023 1806   PROT 7.1 11/12/2023 1806   ALBUMIN 3.8 11/12/2023 1806   AST 26 11/12/2023 1806   ALT 21 11/12/2023 1806   ALKPHOS 83 11/12/2023 1806   BILITOT 0.7 11/12/2023 1806   GFRNONAA >60 11/12/2023 1806   COAGSNo results found for: "INR", "PROTIME" Lipid Panel    Component Value Date/Time   CHOL 110 11/12/2023 2223   TRIG 75 11/12/2023 2223   HDL 56 11/12/2023 2223   CHOLHDL 2.0 11/12/2023 2223   VLDL 15 11/12/2023 2223   LDLCALC 39 11/12/2023 2223   HgbA1C  Lab Results  Component Value Date   HGBA1C 5.9 (H) 11/12/2023   Alcohol Level No results found for: "ETH"   SIGNIFICANT DIAGNOSTIC STUDIES ECHOCARDIOGRAM COMPLETE Result Date: 11/13/2023    ECHOCARDIOGRAM REPORT   Patient Name:   Seth Gonzalez Date of Exam: 11/13/2023  Medical Rec #:  [id number]    Height:       73.0 in Accession #:    [id number]   Weight:       189.2 lb Date of Birth:  25-Nov-1950     BSA:          2.101 m Patient Age:    72 years     BP:           140/93 mmHg Patient Gender: M            HR:           91 bpm. Exam Location:  Inpatient Procedure: 2D Echo, Cardiac Doppler and Color Doppler (Both Spectral and Color            Flow Doppler were utilized during procedure). Indications:    Stroke  History:        Patient has prior history of Echocardiogram examinations.                 Stroke.  Sonographer:    Lamont Snowball Referring Phys: [id number] [facility] IMPRESSIONS  1. Left ventricular ejection fraction, by estimation, is 60 to 65%. The left ventricle has normal function. The left  ventricle has no regional wall motion abnormalities. Left ventricular diastolic parameters are consistent with Grade I diastolic dysfunction (impaired relaxation).  2. Right ventricular systolic function is normal. The right ventricular size is mildly enlarged.  3. The mitral valve is normal in structure. No evidence of mitral valve regurgitation. No evidence of mitral stenosis.  4. The aortic valve is normal in structure. Aortic valve regurgitation is not visualized. No aortic stenosis is present. Conclusion(s)/Recommendation(s): No intracardiac source of embolism detected on this transthoracic study. Consider a transesophageal echocardiogram to exclude cardiac source of embolism if clinically indicated. FINDINGS  Left Ventricle: Left ventricular ejection fraction, by estimation, is 60 to 65%. The left ventricle has normal function. The left ventricle has no regional wall motion abnormalities. The left ventricular internal cavity size was normal in size. There is  no left ventricular hypertrophy. Left ventricular diastolic parameters are consistent with Grade I diastolic dysfunction (impaired relaxation). Normal left ventricular filling pressure. Right Ventricle: The right ventricular size is mildly enlarged. No increase in right ventricular wall thickness. Right ventricular systolic function is normal. Left Atrium: Left atrial size was normal in size. Right Atrium: Right atrial size was normal in size. Pericardium: There is no evidence of pericardial effusion. Mitral Valve: The mitral valve is normal in structure. No evidence of mitral valve regurgitation. No evidence of mitral valve stenosis. MV peak gradient, 6.2 mmHg. The mean mitral valve gradient is 3.0 mmHg. Tricuspid Valve: The tricuspid valve is normal in structure. Tricuspid valve regurgitation is trivial. No evidence of tricuspid stenosis. Aortic Valve: The aortic valve is normal in structure. Aortic valve regurgitation is not visualized. No aortic  stenosis is present. Aortic valve peak gradient measures 8.2 mmHg. Pulmonic Valve: The pulmonic valve was normal in structure. Pulmonic valve regurgitation is not visualized. No evidence of pulmonic stenosis. Aorta: The aortic root is normal in size and structure. Venous: The inferior vena cava was not well visualized. IAS/Shunts: No atrial level shunt detected by color flow Doppler.  LEFT VENTRICLE PLAX 2D LVIDd:         4.50 cm   Diastology LVIDs:         3.30 cm   LV e' medial:    7.72 cm/s LV PW:  0.90 cm   LV E/e' medial:  8.5 LV IVS:        0.90 cm   LV e' lateral:   9.36 cm/s LVOT diam:     2.10 cm   LV E/e' lateral: 7.0 LV SV:         101 LV SV Index:   48 LVOT Area:     3.46 cm  RIGHT VENTRICLE RV Basal diam:  4.30 cm RV S prime:     13.50 cm/s TAPSE (M-mode): 3.0 cm LEFT ATRIUM             Index        RIGHT ATRIUM           Index LA diam:        3.10 cm 1.48 cm/m   RA Area:     13.80 cm LA Vol (A2C):   57.6 ml 27.41 ml/m  RA Volume:   35.50 ml  16.89 ml/m LA Vol (A4C):   25.3 ml 12.04 ml/m LA Biplane Vol: 41.4 ml 19.70 ml/m  AORTIC VALVE AV Area (Vmax): 3.56 cm AV Vmax:        143.00 cm/s AV Peak Grad:   8.2 mmHg LVOT Vmax:      147.00 cm/s LVOT Vmean:     120.000 cm/s LVOT VTI:       0.291 m  AORTA Ao Root diam: 3.50 cm Ao Asc diam:  3.60 cm MITRAL VALVE                TRICUSPID VALVE MV Area (PHT): 3.40 cm     TR Peak grad:   14.6 mmHg MV Area VTI:   4.75 cm     TR Vmax:        191.00 cm/s MV Peak grad:  6.2 mmHg MV Mean grad:  3.0 mmHg     SHUNTS MV Vmax:       1.24 m/s     Systemic VTI:  0.29 m MV Vmean:      77.4 cm/s    Systemic Diam: 2.10 cm MV Decel Time: 223 msec MV E velocity: 65.60 cm/s MV A velocity: 115.00 cm/s MV E/A ratio:  0.57 Armanda Magic MD Electronically signed by Armanda Magic MD Signature Date/Time: 11/13/2023/4:48:15 PM    Final    CT CHEST ABDOMEN PELVIS W CONTRAST Result Date: 11/13/2023 CLINICAL DATA:  Metastatic disease evaluation. Brain metastasis of unknown  primary. EXAM: CT CHEST, ABDOMEN, AND PELVIS WITH CONTRAST TECHNIQUE: Multidetector CT imaging of the chest, abdomen and pelvis was performed following the standard protocol during bolus administration of intravenous contrast. RADIATION DOSE REDUCTION: This exam was performed according to the departmental dose-optimization program which includes automated exposure control, adjustment of the mA and/or kV according to patient size and/or use of iterative reconstruction technique. CONTRAST:  75mL OMNIPAQUE IOHEXOL 350 MG/ML SOLN COMPARISON:  None Available. FINDINGS: CT CHEST FINDINGS Cardiovascular: There is no cardiomegaly or pericardial effusion. There is mild atherosclerotic calcification of the thoracic aorta. No aneurysmal dilatation or dissection. The origins of the great vessels of the aortic arch and the central pulmonary arteries appear patent. Mediastinum/Nodes: No hilar or mediastinal adenopathy. The esophagus is grossly unremarkable. No mediastinal fluid collection. Lungs/Pleura: No focal consolidation, pleural effusion, pneumothorax. The central airways are patent. Musculoskeletal: No acute osseous pathology. CT ABDOMEN PELVIS FINDINGS No intra-abdominal free air or free fluid. Hepatobiliary: The liver is unremarkable. No biliary dilatation. Vicarious excretion of contrast noted in the gallbladder. Pancreas: Unremarkable. No  pancreatic ductal dilatation or surrounding inflammatory changes. Spleen: Normal in size without focal abnormality. Adrenals/Urinary Tract: The right adrenal glands unremarkable. Indeterminate 1 cm left adrenal nodule, possibly an adenoma. This can be better evaluated with MRI on a nonemergent/outpatient basis. There is no hydronephrosis on either side. There is symmetric enhancement and excretion of contrast by both kidneys. The visualized ureters appear unremarkable. The urinary bladder is distended with excreted contrast and appears unremarkable Stomach/Bowel: There is severe  sigmoid diverticulosis and scattered colonic diverticula without active inflammatory changes. There is no bowel obstruction. The appendix is not visualized with certainty. No inflammatory changes identified in the right lower quadrant. Vascular/Lymphatic: Mild aortoiliac atherosclerotic disease. The IVC is unremarkable. No portal venous gas. There is no adenopathy. Reproductive: The prostate and seminal vesicles are grossly unremarkable. No pelvic mass. Other: There is a fat containing right spigelian hernia. No bowel herniation. Musculoskeletal: Osteopenia with degenerative changes spine. No acute osseous pathology. IMPRESSION: 1. No acute intrathoracic, abdominal, or pelvic pathology. No evidence of metastatic disease. 2. Severe sigmoid diverticulosis. No bowel obstruction. 3. Fat containing right Spigelian hernia. No bowel herniation. Electronically Signed   By: Elgie Collard M.D.   On: 11/13/2023 13:56   CT HEAD WO CONTRAST ( ) Result Date: 11/13/2023 CLINICAL DATA:  Hemorrhagic stroke follow-up EXAM: CT HEAD WITHOUT CONTRAST TECHNIQUE: Contiguous axial images were obtained from the base of the skull through the vertex without intravenous contrast. RADIATION DOSE REDUCTION: This exam was performed according to the departmental dose-optimization program which includes automated exposure control, adjustment of the mA and/or kV according to patient size and/or use of iterative reconstruction technique. COMPARISON:  Brain MRI and CT from yesterday FINDINGS: Brain: Compared to CT, unchanged extent of left occipital hematoma measuring up to 2.1 cm. Subdural collection along the left cerebral hemisphere is largely occult compared to MRI. Fairly extensive vasogenic edema which is similar to prior brain MRI. No evidence of interval infarct or hydrocephalus. Small chronic infarct at the left corona radiata. Vascular: No hyperdense vessel or unexpected calcification. Skull: Normal. Negative for fracture or focal  lesion. Sinuses/Orbits: Negative IMPRESSION: Unchanged left occipital hematoma with vasogenic edema. Electronically Signed   By: Tiburcio Pea M.D.   On: 11/13/2023 09:47   CT ANGIO HEAD W OR WO CONTRAST Result Date: 11/12/2023 CLINICAL DATA:  Intracranial hemorrhage EXAM: CT ANGIOGRAPHY HEAD TECHNIQUE: Multidetector CT imaging of the head was performed using the standard protocol during bolus administration of intravenous contrast. Multiplanar CT image reconstructions and MIPs were obtained to evaluate the vascular anatomy. RADIATION DOSE REDUCTION: This exam was performed according to the departmental dose-optimization program which includes automated exposure control, adjustment of the mA and/or kV according to patient size and/or use of iterative reconstruction technique. CONTRAST:  75mL OMNIPAQUE IOHEXOL 350 MG/ML SOLN COMPARISON:  11/12/2022 brain MRI FINDINGS: CT HEAD Brain: Left occipital intraparenchymal hematoma measuring 2.3 x 1.9 cm moderate surrounding edema. Small amount of parafalcine subdural blood. Vascular: No hyperdense vessel or unexpected vascular calcification. Skull: The visualized skull base, calvarium and extracranial soft tissues are normal. Sinuses/Orbits: No fluid levels or advanced mucosal thickening of the visualized paranasal sinuses. No mastoid or middle ear effusion. Normal orbits. Other: None. CTA HEAD POSTERIOR CIRCULATION: Hypoplastic left vertebral artery likely terminates in PICA. Normal right V4 segment. No proximal occlusion of the anterior or inferior cerebellar arteries. Basilar artery is normal. Superior cerebellar arteries are normal. Posterior cerebral arteries are normal. ANTERIOR CIRCULATION: Intracranial internal carotid arteries are normal. Anterior cerebral arteries are normal.  Middle cerebral arteries are normal. Anatomic Variants: Bilateral fetal predominant origins of the posterior cerebral arteries. Review of the MIP images confirms the above findings.  IMPRESSION: 1. Left occipital intraparenchymal hematoma measuring 2.3 x 1.9 cm with moderate surrounding edema. 2. Small amount of parafalcine subdural blood. 3. No vascular abnormality of the intracranial arteries. Electronically Signed   By: Deatra Robinson M.D.   On: 11/12/2023 20:27   MR BRAIN W WO CONTRAST Result Date: 11/12/2023 CLINICAL DATA:  Right-sided peripheral vision loss for 3 weeks. Headache. History of stroke. EXAM: MRI HEAD WITHOUT AND WITH CONTRAST TECHNIQUE: Multiplanar, multiecho pulse sequences of the brain and surrounding structures were obtained without and with intravenous contrast. CONTRAST:  8mL GADAVIST GADOBUTROL 1 MMOL/ML IV SOLN COMPARISON:  Head MRI 01/10/2021 FINDINGS: Brain: There is an approximately 4.5-5 cm hemorrhage in the medial left occipital lobe which is partially intrinsically T1 hyperintense and may be subacute. There is moderate surrounding edema with sulcal effacement and partial effacement of the left lateral ventricle. There is heterogeneous enhancement along the periphery of the hemorrhage of up to moderate thickness, and there is also separate, more confluent enhancement immediately inferior to the hemorrhage in the left occipital lobe which spans 3 cm AP by 2 cm transverse. Mild diffusion weighted signal abnormality along the margins of the hemorrhage demonstrates at most mildly reduced corresponding ADC. No enhancing intracranial lesion is identified elsewhere. No significant chronic hemorrhage is evident elsewhere to suggest underlying cerebral amyloid angiopathy. A small subdural hematoma over the left cerebral convexity measures up to 3 mm in thickness with associated smooth dural enhancement. There is no more than trace rightward midline shift. Patchy T2 hyperintensities elsewhere in the cerebral white matter bilaterally are similar to the prior MRI and are nonspecific but compatible with moderate chronic small vessel ischemic disease. A chronic lacunar infarct  in the left corona radiata was acute on the prior MRI. Dedicated imaging was performed through the sella turcica. The pituitary gland is normal in size and enhances homogeneously. No sellar or suprasellar lesion is identified. The pituitary stalk insert slightly left of midline. The optic chiasm and cavernous sinuses are unremarkable. Vascular: Major intracranial vascular flow voids are preserved. The dural venous sinuses are enhancing. Skull and upper cervical spine: Unremarkable bone marrow signal. Sinuses/Orbits: Bilateral cataract extraction. Mild mucosal thickening in the right maxillary sinus. Clear mastoid air cells. Other: None. Critical Value/emergent results were called by telephone at the time of interpretation on 11/12/2023 at 4:50 pm to provider [facility], who verbally acknowledged these results. IMPRESSION: 1. Left occipital parenchymal hemorrhage with moderate surrounding edema and associated enhancement. This is of indeterminate etiology with considerations including underlying primary or metastatic tumor, hemorrhagic infarct, or vascular malformation. 2. Small subdural hematoma over the left cerebral convexity, presumably reflecting extension of the left occipital hemorrhage. At most trace rightward midline shift. 3. Moderate chronic small vessel ischemic disease. Electronically Signed   By: Sebastian Ache M.D.   On: 11/12/2023 16:52       HISTORY OF PRESENT ILLNESS 73 y.o. patient with history of hypertension, hyperlipidemia, erectile dysfunction and stroke was admitted with right hemianopsia and blurred vision  HOSPITAL COURSE Initial MRI of the brain revealed a left occipital IPH and small subdural hematoma over left cerebral convexity.  CT angiogram of the head and neck revealed no LVO, aneurysm or AVM.  Follow-up head CT was stable.  IPH may have been the result of a hemorrhagic infarct, or there may be an underlying mass.  Patient will need to have a repeat MRI brain with and without  contrast in 2 to 3 months to follow-up and investigate this.  His Plavix was stopped, and he will discuss resumption of antiplatelet therapy when he follows up with neurology.  Due to concern for atrial fibrillation causing hemorrhagic infarct, will provide patient with 30-day cardiac monitor on discharge.  Intraparenchymal Hemorrhage:  left occipital intraparenchymal hematoma  Etiology: Indeterminate -hemorrhagic infarct vs primary hemorrhage with underlying mass given subacute progressive symptoms over 2 to 3 weeks CTA head & neck No vascular abnormality MRI  Left occipital parenchymal hemorrhage with moderate surrounding edema and associated enhancement. This is of indeterminate etiology with considerations including underlying primary or metastatic tumor, hemorrhagic infarct, or vascular malformation. Small subdural hematoma over the left cerebral convexity, presumably reflecting extension of the left occipital hemorrhage. At most trace rightward midline shift. Moderate chronic small vessel ischemic disease. Follow up CT Head- Unchanged left occipital hematoma with vasogenic edema.  2D Echo ejection fraction 60 to 65%.  Left atrial size normal. CT C/A/P -no evidence of malignancy.   LDL 39 HgbA1c 5.9 VTE prophylaxis - SCDs clopidogrel 75 mg daily prior to admission, now on No antithrombotic  Therapy recommendations:  Home Health PT and Home Health OT Disposition: Home   Hx of stroke Stroke in 2022- acute stroke involving left lenticular capsular region, moderate supratentorium small vessel disease   Follows with Dr. Terrace Arabia- on plavix   Hypertension Home meds:  Norvasc, Telmisartan Stable Blood Pressure Goal: SBP 120-160 for first 24 hours then less than 180    Hyperlipidemia Home meds:  Crestor, resumed in hospital LDL 39, goal < 70 Resume statin at discharge   Other Stroke Risk Factors Obesity, Body mass index is 24.96 kg/m., BMI >/= 30 associated with increased stroke risk,  recommend weight loss, diet and exercise as appropriate   RN Pressure Injury Documentation:     DISCHARGE EXAM  PHYSICAL EXAM General:  Alert, well-nourished, well-developed patient in no acute distress Psych:  Mood and affect appropriate for situation CV: Regular rate and rhythm on monitor Respiratory:  Regular, unlabored respirations on room air  NEURO:  Mental Status: AA&Ox3  Speech/Language: speech is without dysarthria or aphasia.    Cranial Nerves:  II: PERRL.  Right hemianopsia III, IV, VI: EOMI. Eyelids elevate symmetrically.  V: Sensation is intact to light touch and symmetrical to face.  VII: Smile is symmetrical.  VIII: hearing intact to voice. IX, X: Phonation is normal.  XII: tongue is midline without fasciculations. Motor: 5/5 strength to all muscle groups tested.  Tone: is normal and bulk is normal Sensation- Intact to light touch bilaterally.  Coordination: FTN intact bilaterally Gait- deferred  1a Level of Conscious.: 0 1b LOC Questions: 0 1c LOC Commands: 0 2 Best Gaze: 0 3 Visual: 2 4 Facial Palsy: 0 5a Motor Arm - left: 0 5b Motor Arm - Right: 0 6a Motor Leg - Left: 0 6b Motor Leg - Right: 0 7 Limb Ataxia: 0 8 Sensory: 0 9 Best Language: 0 10 Dysarthria: 0 11 Extinct. and Inatten.: 0 TOTAL: 2   Discharge Diet       Diet   Diet regular Room service appropriate? Yes with Assist; Fluid consistency: Thin   liquids  DISCHARGE PLAN Disposition: Home with Home Health No antithrombotic for secondary stroke prevention, will discuss resumption of Plavix at neurology follow-up appointment. Ongoing stroke risk factor control by Primary Care Physician at time of discharge Patient advised not  to drive till peripheral vision loss improves Follow-up PCP Tisovec, Adelfa Koh, MD in 2 weeks. Follow-up in [facility] in 8 weeks, office to schedule an appointment.  Patient will need follow-up MRI scan of the brain with and  without contrast to rule out any underlying mass and follow-up visit in 2 months  32 minutes were spent preparing discharge.  Cortney E Ernestina Columbia , MSN, AGACNP-BC [facility] See Amion for schedule and pager information 11/14/2023 1:14 PM   I have personally obtained history,examined this patient, reviewed notes, independently viewed imaging studies, participated in medical decision making and plan of care.ROS completed by me personally and pertinent positives fully documented  I have made any additions or clarifications directly to the above note. Agree with note above.    Delia Heady, MD Medical Director [facility] Pager: [phone] 11/14/2023 4:38 PM

## 2023-11-18 DIAGNOSIS — R270 Ataxia, unspecified: Secondary | ICD-10-CM | POA: Diagnosis not present

## 2023-11-18 DIAGNOSIS — R519 Headache, unspecified: Secondary | ICD-10-CM | POA: Diagnosis not present

## 2023-11-18 DIAGNOSIS — I619 Nontraumatic intracerebral hemorrhage, unspecified: Secondary | ICD-10-CM | POA: Diagnosis not present

## 2023-11-18 DIAGNOSIS — I10 Essential (primary) hypertension: Secondary | ICD-10-CM | POA: Diagnosis not present

## 2023-11-18 DIAGNOSIS — Z8673 Personal history of transient ischemic attack (TIA), and cerebral infarction without residual deficits: Secondary | ICD-10-CM | POA: Diagnosis not present

## 2023-11-18 DIAGNOSIS — E7800 Pure hypercholesterolemia, unspecified: Secondary | ICD-10-CM | POA: Diagnosis not present

## 2023-11-18 DIAGNOSIS — R4701 Aphasia: Secondary | ICD-10-CM | POA: Diagnosis not present

## 2023-11-18 DIAGNOSIS — I679 Cerebrovascular disease, unspecified: Secondary | ICD-10-CM | POA: Diagnosis not present

## 2023-11-18 DIAGNOSIS — H53451 Other localized visual field defect, right eye: Secondary | ICD-10-CM | POA: Diagnosis not present

## 2023-11-21 DIAGNOSIS — I611 Nontraumatic intracerebral hemorrhage in hemisphere, cortical: Secondary | ICD-10-CM | POA: Diagnosis not present

## 2023-11-21 DIAGNOSIS — I639 Cerebral infarction, unspecified: Secondary | ICD-10-CM | POA: Diagnosis not present

## 2023-11-25 ENCOUNTER — Encounter: Payer: Self-pay | Admitting: Neurology

## 2023-11-25 ENCOUNTER — Ambulatory Visit: Admitting: Neurology

## 2023-11-25 VITALS — BP 149/87 | HR 85 | Ht 75.0 in | Wt 186.0 lb

## 2023-11-25 DIAGNOSIS — I629 Nontraumatic intracranial hemorrhage, unspecified: Secondary | ICD-10-CM | POA: Diagnosis not present

## 2023-11-25 DIAGNOSIS — I639 Cerebral infarction, unspecified: Secondary | ICD-10-CM | POA: Diagnosis not present

## 2023-11-25 NOTE — Progress Notes (Signed)
 Chief Complaint  Patient presents with   New Patient (Initial Visit)    Rm15, here with  NX [name] LV 2022/urgent/[facility]/Richard Tisovec MD [phone]: pt states he is having daily headaches on the left side of head, states takes tylenol. Pt states he is also having vision changes on his right eye.      ASSESSMENT AND PLAN  Seth Gonzalez is a 73 y.o. 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   MEDICAL HISTORY:  Seth Gonzalez is a 73 year old male, seen in request by his primary care doctor Tisovec, Richard to follow-up for left occipital intraparenchymal hemorrhage,    History is obtained from the patient and review of electronic medical records. I personally reviewed pertinent available imaging films in PACS.   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, presenting with acute onset of right-sided weakness, recovered very well, since then he was switched from aspirin to Plavix 75 mg daily  He was admitted to the hospital on November 12, 2023 after presenting to emergency room with complaints 3 weeks history of gradual worsening right visual field deficit,  MRI of the brain revealed left occipital intracranial hemorrhage with small subdural hematoma over the left cerebral convexity  CT angiogram of head and neck showed no large vessel disease, no  vascular malformation  LDL 39, A1c 5.9, echocardiogram ejection fraction 60 to 65%  CT abdomen chest and pelvic showed no evidence of malignancy  His Plavix was stopped since his hospital admission  Since March, he complains of mild to moderate left side headaches, persistent right visual field deficit, otherwise eating well, no focal motor deficit, sleeps well,   PHYSICAL EXAM:   Vitals:   11/25/23 0852  BP: (!) 149/87  Pulse: 85  Weight: 186 lb (84.4 kg)  Height: 6\' 3"  (1.905 m)     Body mass index is 23.25 kg/m.  PHYSICAL EXAMNIATION:  Gen: NAD, conversant, well nourised, well groomed                     Cardiovascular: Regular rate rhythm, no peripheral edema, warm, nontender. Eyes: Conjunctivae clear without exudates or hemorrhage Neck: Supple, no carotid bruits. Pulmonary: Clear to auscultation bilaterally   NEUROLOGICAL EXAM:  MENTAL STATUS: Speech/cognition: Awake, alert, oriented to history taking and casual conversation CRANIAL NERVES: CN II: Right hemianopia on confrontational test, pupils are round equal and briskly reactive to light. CN III, IV, VI: extraocular movement are normal. No ptosis. CN V: Facial sensation is intact to light touch CN VII: Face is symmetric with normal eye closure  CN VIII: Hearing is normal to causal conversation. CN IX, X: Phonation is normal. CN XI: Head turning and shoulder shrug are intact  MOTOR: There is no pronator drift of out-stretched arms. Muscle bulk and tone are normal. Muscle strength is normal.  REFLEXES: Reflexes  are 2+ and symmetric at the biceps, triceps, knees, and ankles. Plantar responses are flexor.  SENSORY: Intact to light touch, pinprick and vibratory sensation are intact in fingers and toes.  COORDINATION: There is no trunk or limb dysmetria noted.  GAIT/STANCE: Posture is normal. Gait is steady    REVIEW OF SYSTEMS:  Full 14 system review of systems performed and notable only for as  above All other review of systems were negative.   ALLERGIES: No Known Allergies  HOME MEDICATIONS: Current Outpatient Medications  Medication Sig Dispense Refill   amLODipine (NORVASC) 5 MG tablet Take 5 mg by mouth at bedtime.     AYR 0.65 % nasal spray Place 1 spray into the nose as needed for congestion.     loratadine (CLARITIN) 10 MG tablet Take 10 mg by mouth daily as needed (for seasonal allergies).     Multiple Vitamin (MULTIVITAMIN) tablet Take 1 tablet by mouth 3 (three) times a week.     rosuvastatin (CRESTOR) 20 MG tablet Take 20 mg by mouth daily.     sildenafil (REVATIO) 20 MG tablet Take 100 mg by mouth daily as needed (for E.D.).  5   telmisartan (MICARDIS) 80 MG tablet Take 80 mg by mouth daily.     No current facility-administered medications for this visit.    PAST MEDICAL HISTORY: Past Medical History:  Diagnosis Date   Hearing loss    Hypertension     PAST SURGICAL HISTORY: Past Surgical History:  Procedure Laterality Date   APPENDECTOMY     CATARACT EXTRACTION, BILATERAL     COLONOSCOPY  2009   DENTAL SURGERY     NASAL SEPTUM SURGERY      FAMILY HISTORY: Family History  Problem Relation Age of Onset   Dementia Maternal Aunt    Dementia Paternal Uncle    Colon cancer Neg Hx    Colon polyps Neg Hx    Esophageal cancer Neg Hx    Rectal cancer Neg Hx    Stomach cancer Neg Hx     SOCIAL HISTORY: Social History   Socioeconomic History   Marital status: Divorced    Spouse name: Not on file   Number of children: Not on file   Years of education: Not on file   Highest education level: Not on file  Occupational History   Not on file  Tobacco Use   Smoking status: Never   Smokeless tobacco: Never  Vaping Use   Vaping status: Never Used  Substance and Sexual Activity   Alcohol use: No   Drug use: No   Sexual activity: Not on file  Other Topics Concern   Not on file  Social History Narrative   Pt lives alone in 2 story home    Divorced   No children   Has associates degree   Works as Comptroller.    Social Drivers of Corporate investment banker Strain: Not on file  Food Insecurity: No Food Insecurity (11/13/2023)   Hunger Vital Sign    Worried About Running Out of Food in the Last Year: Never true    Ran Out of Food in the Last Year: Never true  Transportation Needs: No Transportation Needs (11/13/2023)   PRAPARE - Administrator, Civil Service (Medical): No    Lack of Transportation (Non-Medical): No  Physical Activity: Not on file  Stress: Not on file  Social Connections: Moderately Isolated (11/13/2023)   Social Connection and Isolation Panel [NHANES]  Frequency of Communication with Friends and Family: Twice a week    Frequency of Social Gatherings with Friends and Family: Once a week    Attends Religious Services: Never    Database administrator or Organizations: No    Attends Engineer, structural: 1 to 4 times per year    Marital Status: Divorced  Intimate Partner Violence: Not At Risk (11/13/2023)   Humiliation, Afraid, Rape, and Kick questionnaire    Fear of Current or Ex-Partner: No    Emotionally Abused: No    Physically Abused: No    Sexually Abused: No      Levert Feinstein, M.D. Ph.D.  [facility] [address], [address] [place], [place] [address] Ph: [phone] Fax: [phone]  CC:  Tisovec, Adelfa Koh, MD [address] [place],  [place] [address]  Tisovec, Adelfa Koh, MD

## 2023-11-27 ENCOUNTER — Telehealth: Payer: Self-pay | Admitting: Neurology

## 2023-11-27 NOTE — Telephone Encounter (Signed)
 no auth required sent to GI [phone]

## 2023-12-23 ENCOUNTER — Ambulatory Visit: Attending: Cardiology

## 2023-12-23 DIAGNOSIS — H53461 Homonymous bilateral field defects, right side: Secondary | ICD-10-CM | POA: Diagnosis not present

## 2023-12-23 DIAGNOSIS — I639 Cerebral infarction, unspecified: Secondary | ICD-10-CM

## 2023-12-23 DIAGNOSIS — I611 Nontraumatic intracerebral hemorrhage in hemisphere, cortical: Secondary | ICD-10-CM

## 2023-12-26 DIAGNOSIS — I491 Atrial premature depolarization: Secondary | ICD-10-CM

## 2023-12-26 DIAGNOSIS — I440 Atrioventricular block, first degree: Secondary | ICD-10-CM

## 2023-12-26 DIAGNOSIS — I639 Cerebral infarction, unspecified: Secondary | ICD-10-CM

## 2023-12-26 DIAGNOSIS — I493 Ventricular premature depolarization: Secondary | ICD-10-CM

## 2023-12-26 DIAGNOSIS — I611 Nontraumatic intracerebral hemorrhage in hemisphere, cortical: Secondary | ICD-10-CM

## 2023-12-29 ENCOUNTER — Telehealth: Payer: Self-pay

## 2023-12-29 NOTE — Telephone Encounter (Signed)
-----   Message from Debria Fang sent at 12/26/2023  3:23 PM EDT ----- Please tell patient that his recent cardiac monitor showed normal sinus rhythm with average HR 72 BPM. There were no pauses, no AV block, no atrial fibrillation. Overall normal study.   Thanks [employer]

## 2023-12-29 NOTE — Telephone Encounter (Signed)
 Called patient advised of below they verbalized understanding.

## 2024-01-01 ENCOUNTER — Telehealth: Payer: Self-pay | Admitting: Neurology

## 2024-01-01 DIAGNOSIS — I629 Nontraumatic intracranial hemorrhage, unspecified: Secondary | ICD-10-CM

## 2024-01-01 NOTE — Telephone Encounter (Signed)
 Pt said having a new symptom:some memory loss, can hardly remember things use to know, what the letters look like, difficulty reading something and comprehend. Would like a call from the nurse.

## 2024-01-01 NOTE — Telephone Encounter (Signed)
 Call to patient, he states since last visit, he has noticed worsening memory, difficulty reading and comprehending.   Verbal discussion with Dr. Gracie Lav and she advised to  place order for Speech therapy and keep August appt.

## 2024-01-13 ENCOUNTER — Telehealth: Payer: Self-pay | Admitting: Neurology

## 2024-01-13 ENCOUNTER — Ambulatory Visit (HOSPITAL_COMMUNITY): Admitting: Speech Pathology

## 2024-01-13 NOTE — Telephone Encounter (Signed)
 On schedule for follow up on August 4th, it is OK to keep that

## 2024-01-13 NOTE — Telephone Encounter (Signed)
 Dr. Gracie Lav- I LVM, waiting on call back. Did you want to see pt?

## 2024-01-13 NOTE — Telephone Encounter (Addendum)
 Last seen 11/25/23. Dx: CVA/hemorrhage. Has MRI Brain/MRI angio neck scheduled for 01/27/24 at [facility].   Called sister in law, Devra Fontana (on [place]) and LVM asking she call office back.

## 2024-01-13 NOTE — Telephone Encounter (Signed)
 Pt was brought in by [name], [facility] testing was cancelled for today and MRI is scheduled for 2 weeks from today and she is saying pt has declined immmensly since last 13 days and everday is gettign worse. He cannot walk and is not able to get out the words he wants to say nor can he rememebr what others tell him nor can he read texts.  Please call [name] patricia hubbard to talk to her regarding Plato.

## 2024-01-14 ENCOUNTER — Emergency Department (HOSPITAL_COMMUNITY)

## 2024-01-14 ENCOUNTER — Encounter (HOSPITAL_COMMUNITY): Payer: Self-pay | Admitting: Emergency Medicine

## 2024-01-14 ENCOUNTER — Telehealth: Payer: Self-pay | Admitting: Neurology

## 2024-01-14 ENCOUNTER — Inpatient Hospital Stay (HOSPITAL_COMMUNITY)
Admission: EM | Admit: 2024-01-14 | Discharge: 2024-01-17 | DRG: 054 | Disposition: A | Discharge status: Hospice | Attending: Internal Medicine | Admitting: Internal Medicine

## 2024-01-14 DIAGNOSIS — G939 Disorder of brain, unspecified: Secondary | ICD-10-CM | POA: Diagnosis not present

## 2024-01-14 DIAGNOSIS — E785 Hyperlipidemia, unspecified: Secondary | ICD-10-CM | POA: Diagnosis not present

## 2024-01-14 DIAGNOSIS — C714 Malignant neoplasm of occipital lobe: Secondary | ICD-10-CM | POA: Diagnosis not present

## 2024-01-14 DIAGNOSIS — G9389 Other specified disorders of brain: Secondary | ICD-10-CM | POA: Diagnosis not present

## 2024-01-14 DIAGNOSIS — Z8673 Personal history of transient ischemic attack (TIA), and cerebral infarction without residual deficits: Secondary | ICD-10-CM

## 2024-01-14 DIAGNOSIS — R900 Intracranial space-occupying lesion found on diagnostic imaging of central nervous system: Secondary | ICD-10-CM | POA: Diagnosis not present

## 2024-01-14 DIAGNOSIS — I672 Cerebral atherosclerosis: Secondary | ICD-10-CM | POA: Diagnosis not present

## 2024-01-14 DIAGNOSIS — R220 Localized swelling, mass and lump, head: Secondary | ICD-10-CM | POA: Diagnosis not present

## 2024-01-14 DIAGNOSIS — D649 Anemia, unspecified: Secondary | ICD-10-CM | POA: Insufficient documentation

## 2024-01-14 DIAGNOSIS — R4182 Altered mental status, unspecified: Secondary | ICD-10-CM | POA: Diagnosis not present

## 2024-01-14 DIAGNOSIS — I10 Essential (primary) hypertension: Secondary | ICD-10-CM | POA: Insufficient documentation

## 2024-01-14 DIAGNOSIS — R29818 Other symptoms and signs involving the nervous system: Secondary | ICD-10-CM | POA: Diagnosis not present

## 2024-01-14 DIAGNOSIS — K219 Gastro-esophageal reflux disease without esophagitis: Secondary | ICD-10-CM | POA: Diagnosis present

## 2024-01-14 DIAGNOSIS — G936 Cerebral edema: Secondary | ICD-10-CM | POA: Diagnosis not present

## 2024-01-14 DIAGNOSIS — D72829 Elevated white blood cell count, unspecified: Secondary | ICD-10-CM | POA: Diagnosis present

## 2024-01-14 DIAGNOSIS — N529 Male erectile dysfunction, unspecified: Secondary | ICD-10-CM | POA: Diagnosis not present

## 2024-01-14 DIAGNOSIS — E222 Syndrome of inappropriate secretion of antidiuretic hormone: Secondary | ICD-10-CM | POA: Diagnosis present

## 2024-01-14 DIAGNOSIS — Z79899 Other long term (current) drug therapy: Secondary | ICD-10-CM

## 2024-01-14 DIAGNOSIS — Z515 Encounter for palliative care: Secondary | ICD-10-CM

## 2024-01-14 DIAGNOSIS — H53461 Homonymous bilateral field defects, right side: Secondary | ICD-10-CM | POA: Diagnosis not present

## 2024-01-14 DIAGNOSIS — I700 Atherosclerosis of aorta: Secondary | ICD-10-CM | POA: Diagnosis not present

## 2024-01-14 DIAGNOSIS — R262 Difficulty in walking, not elsewhere classified: Secondary | ICD-10-CM | POA: Diagnosis present

## 2024-01-14 DIAGNOSIS — H9190 Unspecified hearing loss, unspecified ear: Secondary | ICD-10-CM | POA: Diagnosis present

## 2024-01-14 DIAGNOSIS — D630 Anemia in neoplastic disease: Secondary | ICD-10-CM | POA: Diagnosis present

## 2024-01-14 DIAGNOSIS — R410 Disorientation, unspecified: Secondary | ICD-10-CM | POA: Diagnosis present

## 2024-01-14 DIAGNOSIS — E871 Hypo-osmolality and hyponatremia: Secondary | ICD-10-CM | POA: Diagnosis not present

## 2024-01-14 DIAGNOSIS — Z559 Problems related to education and literacy, unspecified: Secondary | ICD-10-CM | POA: Diagnosis not present

## 2024-01-14 LAB — CBG MONITORING, ED: Glucose-Capillary: 99 mg/dL (ref 70–99)

## 2024-01-14 LAB — COMPREHENSIVE METABOLIC PANEL WITH GFR
ALT: 21 U/L (ref 0–44)
AST: 22 U/L (ref 15–41)
Albumin: 3.8 g/dL (ref 3.5–5.0)
Alkaline Phosphatase: 67 U/L (ref 38–126)
Anion gap: 11 (ref 5–15)
BUN: 15 mg/dL (ref 8–23)
CO2: 20 mmol/L — ABNORMAL LOW (ref 22–32)
Calcium: 8.7 mg/dL — ABNORMAL LOW (ref 8.9–10.3)
Chloride: 100 mmol/L (ref 98–111)
Creatinine, Ser: 0.82 mg/dL (ref 0.61–1.24)
GFR, Estimated: >60 mL/min (ref >60)
Glucose, Bld: 107 mg/dL — ABNORMAL HIGH (ref 70–99)
Potassium: 3.9 mmol/L (ref 3.5–5.1)
Sodium: 131 mmol/L — ABNORMAL LOW (ref 135–145)
Total Bilirubin: 0.4 mg/dL (ref 0.0–1.2)
Total Protein: 6.4 g/dL — ABNORMAL LOW (ref 6.5–8.1)

## 2024-01-14 LAB — CBC WITH DIFFERENTIAL/PLATELET
Abs Immature Granulocytes: 0.04 10*3/uL (ref 0.00–0.07)
Basophils Absolute: 0.1 10*3/uL (ref 0.0–0.1)
Basophils Relative: 1 %
Eosinophils Absolute: 0.1 10*3/uL (ref 0.0–0.5)
Eosinophils Relative: 1 %
HCT: 37.4 % — ABNORMAL LOW (ref 39.0–52.0)
Hemoglobin: 12.5 g/dL — ABNORMAL LOW (ref 13.0–17.0)
Immature Granulocytes: 1 %
Lymphocytes Relative: 18 %
Lymphs Abs: 1.4 10*3/uL (ref 0.7–4.0)
MCH: 30.5 pg (ref 26.0–34.0)
MCHC: 33.4 g/dL (ref 30.0–36.0)
MCV: 91.2 fL (ref 80.0–100.0)
Monocytes Absolute: 0.6 10*3/uL (ref 0.1–1.0)
Monocytes Relative: 7 %
Neutro Abs: 5.8 10*3/uL (ref 1.7–7.7)
Neutrophils Relative %: 72 %
Platelets: 282 10*3/uL (ref 150–400)
RBC: 4.1 MIL/uL — ABNORMAL LOW (ref 4.22–5.81)
RDW: 11.9 % (ref 11.5–15.5)
WBC: 8 10*3/uL (ref 4.0–10.5)
nRBC: 0 % (ref 0.0–0.2)

## 2024-01-14 LAB — I-STAT CHEM 8, ED
BUN: 16 mg/dL (ref 8–23)
Calcium, Ion: 1.13 mmol/L — ABNORMAL LOW (ref 1.15–1.40)
Chloride: 98 mmol/L (ref 98–111)
Creatinine, Ser: 0.9 mg/dL (ref 0.61–1.24)
Glucose, Bld: 93 mg/dL (ref 70–99)
HCT: 37 % — ABNORMAL LOW (ref 39.0–52.0)
Hemoglobin: 12.6 g/dL — ABNORMAL LOW (ref 13.0–17.0)
Potassium: 4 mmol/L (ref 3.5–5.1)
Sodium: 132 mmol/L — ABNORMAL LOW (ref 135–145)
TCO2: 22 mmol/L (ref 22–32)

## 2024-01-14 LAB — URINALYSIS, ROUTINE W REFLEX MICROSCOPIC
Bilirubin Urine: NEGATIVE
Glucose, UA: NEGATIVE mg/dL
Hgb urine dipstick: NEGATIVE
Ketones, ur: NEGATIVE mg/dL
Leukocytes,Ua: NEGATIVE
Nitrite: NEGATIVE
Protein, ur: NEGATIVE mg/dL
Specific Gravity, Urine: 1.014 (ref 1.005–1.030)
pH: 6 (ref 5.0–8.0)

## 2024-01-14 LAB — CBC
HCT: 37.4 % — ABNORMAL LOW (ref 39.0–52.0)
Hemoglobin: 12.5 g/dL — ABNORMAL LOW (ref 13.0–17.0)
MCH: 30.4 pg (ref 26.0–34.0)
MCHC: 33.4 g/dL (ref 30.0–36.0)
MCV: 91 fL (ref 80.0–100.0)
Platelets: 292 10*3/uL (ref 150–400)
RBC: 4.11 MIL/uL — ABNORMAL LOW (ref 4.22–5.81)
RDW: 12 % (ref 11.5–15.5)
WBC: 7.9 10*3/uL (ref 4.0–10.5)
nRBC: 0 % (ref 0.0–0.2)

## 2024-01-14 LAB — PROTIME-INR
INR: 1.1 (ref 0.8–1.2)
Prothrombin Time: 14.1 s (ref 11.4–15.2)

## 2024-01-14 LAB — APTT: aPTT: 29 s (ref 24–36)

## 2024-01-14 LAB — ETHANOL: Alcohol, Ethyl (B): <15 mg/dL (ref <15)

## 2024-01-14 MED ORDER — ACETAMINOPHEN 325 MG PO TABS: 650.0000 mg | ORAL_TABLET | ORAL | Status: DC | PRN

## 2024-01-14 MED ORDER — PANTOPRAZOLE SODIUM 40 MG PO TBEC
40.0000 mg | DELAYED_RELEASE_TABLET | Freq: Every day | ORAL | Status: DC
  Administered 2024-01-15 – 2024-01-17 (×3): 40 mg via ORAL
  Filled 2024-01-14 (×3): qty 1

## 2024-01-14 MED ORDER — DEXAMETHASONE SODIUM PHOSPHATE 10 MG/ML IJ SOLN
6.0000 mg | Freq: Four times a day (QID) | INTRAMUSCULAR | Status: DC
  Administered 2024-01-15 – 2024-01-17 (×11): 6 mg via INTRAVENOUS
  Filled 2024-01-14 (×11): qty 1

## 2024-01-14 MED ORDER — GADOBUTROL 1 MMOL/ML IV SOLN
8.0000 mL | Freq: Once | INTRAVENOUS | Status: AC | PRN
  Administered 2024-01-14: 8 mL via INTRAVENOUS

## 2024-01-14 MED ORDER — ACETAMINOPHEN 160 MG/5ML PO SOLN: 650.0000 mg | ORAL | Status: DC | PRN

## 2024-01-14 MED ORDER — ACETAMINOPHEN 650 MG RE SUPP: 650.0000 mg | RECTAL | Status: DC | PRN

## 2024-01-14 MED ORDER — SODIUM CHLORIDE 0.9 % IV SOLN: INTRAVENOUS | Status: DC

## 2024-01-14 MED ORDER — DEXAMETHASONE SODIUM PHOSPHATE 10 MG/ML IJ SOLN
10.0000 mg | Freq: Once | INTRAMUSCULAR | Status: AC
  Administered 2024-01-14: 10 mg via INTRAVENOUS
  Filled 2024-01-14: qty 1

## 2024-01-14 MED ORDER — IOHEXOL 350 MG/ML SOLN
75.0000 mL | Freq: Once | INTRAVENOUS | Status: AC | PRN
  Administered 2024-01-14: 75 mL via INTRAVENOUS

## 2024-01-14 MED ORDER — SENNOSIDES-DOCUSATE SODIUM 8.6-50 MG PO TABS
1.0000 | ORAL_TABLET | Freq: Every evening | ORAL | Status: DC | PRN
  Administered 2024-01-15: 1 via ORAL
  Filled 2024-01-14: qty 1

## 2024-01-14 NOTE — ED Notes (Signed)
Pt resting, no distress noted. Call bell within reach.

## 2024-01-14 NOTE — Telephone Encounter (Signed)
 Pt sister returning phone call from yesterday. Pt hung up  as I was waiting to transfer her .

## 2024-01-14 NOTE — ED Provider Notes (Signed)
 [place] EMERGENCY DEPARTMENT AT [facility] Provider Note   CSN: [id number] Arrival date & time: 01/14/24  1414     History  Chief Complaint  Patient presents with   Altered Mental Status    Seth Gonzalez is a 73 y.o. male.  Patient is a 74 year old male who presents emergency department by EMS secondary to increased altered mental status.  Patient notes that he has not been feeling right for approximate the past 2 days but history is limited is he is having difficulty finding his words.  Patient was in the emergency department and admitted approximately 1 month ago secondary to intracranial hemorrhage.  Patient currently denies any recent falls or blunt head injury.  He does note that he feels slightly weaker on his right side.  He denies any active headache at this time.  He denies any chest pain, shortness of breath, Donnell pain, nausea, vomiting, diarrhea.   Altered Mental Status      Home Medications Prior to Admission medications   Medication Sig Start Date End Date Taking? Authorizing Provider  amLODipine  (NORVASC ) 5 MG tablet Take 5 mg by mouth at bedtime.    [provider]  AYR 0.65 % nasal spray Place 1 spray into the nose as needed for congestion.    [provider]  loratadine (CLARITIN) 10 MG tablet Take 10 mg by mouth daily as needed (for seasonal allergies).    [provider]  Multiple Vitamin (MULTIVITAMIN) tablet Take 1 tablet by mouth 3 (three) times a week.    [provider]  rosuvastatin (CRESTOR) 20 MG tablet Take 20 mg by mouth daily. 01/10/21   [provider]  sildenafil (REVATIO) 20 MG tablet Take 100 mg by mouth daily as needed (for E.D.). 09/19/17   [provider]  telmisartan (MICARDIS) 80 MG tablet Take 80 mg by mouth daily.    [provider]      Allergies    Patient has no known allergies.    Review of Systems   Review of Systems  Neurological:  Positive for speech  difficulty.       Confusion    Physical Exam Updated Vital Signs BP (!) 145/83 (BP Location: Right Arm)   Pulse 99   Temp 99 F (37.2 C) (Oral)   Resp 18   SpO2 99%  Physical Exam Vitals and nursing note reviewed.  Constitutional:      Appearance: Normal appearance.  HENT:     Head: Normocephalic and atraumatic.     Nose: Nose normal.     Mouth/Throat:     Mouth: Mucous membranes are moist.  Eyes:     Extraocular Movements: Extraocular movements intact.     Conjunctiva/sclera: Conjunctivae normal.     Pupils: Pupils are equal, round, and reactive to light.  Cardiovascular:     Rate and Rhythm: Normal rate and regular rhythm.     Pulses: Normal pulses.     Heart sounds: Normal heart sounds. No murmur heard.    No gallop.  Pulmonary:     Effort: Pulmonary effort is normal. No respiratory distress.     Breath sounds: Normal breath sounds. No stridor. No wheezing, rhonchi or rales.  Abdominal:     General: Abdomen is flat. Bowel sounds are normal. There is no distension.     Palpations: Abdomen is soft.     Tenderness: There is no abdominal tenderness. There is no guarding.  Musculoskeletal:  General: Normal range of motion.     Cervical back: Normal range of motion and neck supple.  Skin:    General: Skin is warm and dry.  Neurological:     General: No focal deficit present.     Mental Status: He is alert and oriented to person, place, and time. Mental status is at baseline.     Cranial Nerves: No cranial nerve deficit.     Sensory: No sensory deficit.     Motor: No weakness.     Coordination: Coordination normal.     Gait: Gait normal.     Comments: Dysarthria noted, finger-to-nose intact, normal rapid alternating movements, normal heel-to-shin, strength 5 out of 5 in bilateral upper and lower extremities, cranial nerves II through XII intact  Psychiatric:        Mood and Affect: Mood normal.        Behavior: Behavior normal.        Thought Content: Thought  content normal.        Judgment: Judgment normal.     ED Results / Procedures / Treatments   Labs (all labs ordered are listed, but only abnormal results are displayed) Labs Reviewed  CBC - Abnormal; Notable for the following components:      Result Value   RBC 4.11 (*)    Hemoglobin 12.5 (*)    HCT 37.4 (*)    All other components within normal limits  COMPREHENSIVE METABOLIC PANEL WITH GFR  URINALYSIS, ROUTINE W REFLEX MICROSCOPIC  ETHANOL  PROTIME-INR  APTT  DIFFERENTIAL  RAPID URINE DRUG SCREEN, HOSP PERFORMED  CBG MONITORING, ED  I-STAT CHEM 8, ED    EKG None  Radiology No results found.  Procedures Procedures    Medications Ordered in ED Medications - No data to display  ED Course/ Medical Decision Making/ A&P                                 Medical Decision Making Amount and/or Complexity of Data Reviewed Labs: ordered. Radiology: ordered.  Risk Prescription drug management. Decision regarding hospitalization.   This patient presents to the ED for concern of altered mental status, dysarthria, gait imbalance, this involves an extensive number of treatment options, and is a complaint that carries with it a high risk of complications and morbidity.  The differential diagnosis includes CVA, TIA, intracranial hemorrhage, intracranial mass, electrolyte derangement, acute kidney injury, deconditioning   Co morbidities that complicate the patient evaluation  Previous intracranial lesion   Additional history obtained:  Additional history obtained from medical records External records from outside source obtained and reviewed including medical records   Lab Tests:  I Ordered, and personally interpreted labs.  The pertinent results include: No leukocytosis, mild anemia, mild hyponatremia, normal kidney function liver function, unremarkable urinalysis   Imaging Studies ordered:  I ordered imaging studies including CTA of head, MRI brain I  independently visualized and interpreted imaging which showed intracranial mass within the left occipital lobe with surrounding edema I agree with the radiologist interpretation   Cardiac Monitoring: / EKG:  The patient was maintained on a cardiac monitor.  I personally viewed and interpreted the cardiac monitored which showed an underlying rhythm of: Normal sinus rhythm, rate of 77, normal PR/QRS interval, normal QTc, nonspecific T wave change, no ST changes, no STEMI   Consultations Obtained:  I requested consultation with the neurosurgery, Dr. Larrie Po,  and discussed lab and  imaging findings as well as pertinent plan - they recommend: Admission, Decadron, neurosurgery consult   Problem List / ED Course / Critical interventions / Medication management  Patient does remain stable at this time.  Did discuss with patient that CT scan of the head and MRI is concerning for intracranial mass with associated edema.  Did discuss patient findings with neurosurgery who did recommend admission to the hospitalist service and they will consult on the patient.  They did recommend continuing Decadron every 6 hours.  Patient does otherwise remain stable at this point.  He has no signs of acute respiratory distress.  Blood work has been overall unremarkable.  Have discussed patient case with Dr. [facility] Lavender with the hospitalist service who has excepted for admission at this time. I ordered medication including Decadron for intracranial mass and edema Reevaluation of the patient after these medicines showed that the patient improved I have reviewed the patients home medicines and have made adjustments as needed   Social Determinants of Health:  None   Test / Admission - Considered:  Admission        Final Clinical Impression(s) / ED Diagnoses Final diagnoses:  None    Rx / DC Orders ED Discharge Orders     None         Emmalene Hare 01/14/24 2131    Almond Army, MD 01/15/24 2059

## 2024-01-14 NOTE — Telephone Encounter (Signed)
 Pt's sister called and message was relayed. She stated that it was unacceptable and she is wanting to speak to someone Asap.

## 2024-01-14 NOTE — Telephone Encounter (Signed)
 Returned call to Seth Gonzalez and she stated that the patient has gotten worse 2 weeks ago aphasia, couldn't mow yard, not able to do as much for himself. Its gotten so bad that he has forgotten family members names. Seth Gonzalez is insistent that Dr. Gracie Lav needs to see him and says he will get way worse by then. She feels like Dr. Gracie Lav needs to work him in sooner. I asked pt had signs of uti and Seth Gonzalez denied pt having uti symptoms. Seth Gonzalez stated that Dr. Gracie Lav better take this serious and if she doesn't see him she will call the medical board on her and report her for negligence in caring for a patient. 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 Gracie Lav,  We currently see for cva and Intracranial hemorrhage. Does the pt need a referral for memory or can they be worked in sooner and we do moca or mmse at an earlier appt than the one in August? .

## 2024-01-14 NOTE — Telephone Encounter (Signed)
 Called and spoke to Seth Gonzalez (on dpr) she stated that the pt is currently on his way to the hospital. I instructed them to follow the hospital doctors orders and call us  when they are discharged so that we can get him in for a sooner appt. Devra Fontana voiced gratitude and understanding.

## 2024-01-14 NOTE — ED Notes (Signed)
 Floor notified that patient is on the way upstairs.

## 2024-01-14 NOTE — Telephone Encounter (Signed)
Ok to work in.

## 2024-01-14 NOTE — ED Notes (Signed)
 Pt taken to MRI

## 2024-01-14 NOTE — ED Provider Triage Note (Signed)
 Emergency Medicine Provider Triage Evaluation Note  Seth Gonzalez , a 73 y.o. male  was evaluated in triage.  Pt complains of AMS.  Review of Systems  Positive: Confusion,  Negative: CP, SHOB, thinners (he thinks)  Physical Exam  BP (!) 145/83 (BP Location: Right Arm)   Pulse 99   Temp 99 F (37.2 C) (Oral)   Resp 18   SpO2 99%  Gen:   Awake, no distress   Resp:  Normal effort  MSK:   Moves extremities without difficulty  Other:  Alert to self only, normal finger-to-nose, no facial droop   Medical Decision Making  Medically screening exam initiated at 3:08 PM.  Appropriate orders placed.  Seth Gonzalez was informed that the remainder of the evaluation will be completed by another provider, this initial triage assessment does not replace that evaluation, and the importance of remaining in the ED until their evaluation is complete.  Labs and imaging ordered   Seth Gonzalez 01/14/24 1884

## 2024-01-14 NOTE — ED Notes (Addendum)
 Pt's friend at bedside. Reports had a stroke recently and about a week ago started having trouble talking. Says she spoke with neurology but they couldn't get him in for an appointment and that his trouble with speech was too be expected.  Pt without complaints for me right now. Seems a little confused to the situation. Answers yes/no questions appropriately.

## 2024-01-14 NOTE — ED Notes (Signed)
 Pt back from MRI.  Placed back on the monitor. Family at bedside. No needs voiced at this time.

## 2024-01-14 NOTE — H&P (Signed)
 History and Physical    Seth Gonzalez ZOX:[id number] DOB: June 16, 1951 DOA: 01/14/2024  Patient coming from: Home.  Chief Complaint: Difficulty talking.  HPI: Seth Gonzalez is a 73 y.o. male with c/o prior stroke, hypertension was admitted in March 2025 for left occipital intraparenchymal hemorrhage with possible mass has been having some difficulty talking last 2 days as noticed by family.  Patient was finding it difficult to bring out words.  Patient states he is also has been having some difficulty walking with unstable gait.  Denies any headache nausea vomiting.  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  ER physician discussed with on-call neurosurgeon Dr. Larrie Po who advised patient to be started on IV steroids and Dr. Cabbell will be seeing patient in consult.  Labs show anemia and sodium of 131.   Review of Systems: As per HPI, rest all negative.   Past Medical History:  Diagnosis Date   Hearing loss    Hypertension     Past Surgical History:  Procedure Laterality Date   APPENDECTOMY     CATARACT EXTRACTION, BILATERAL     COLONOSCOPY  2009   DENTAL SURGERY     NASAL SEPTUM SURGERY       reports that he has never smoked. He has never used smokeless tobacco. He reports that he does not drink alcohol  and does not use drugs.  No Known Allergies  Family History  Problem Relation Age of Onset   Dementia Maternal Aunt    Dementia Paternal Uncle    Colon cancer Neg Hx    Colon polyps Neg Hx    Esophageal cancer Neg Hx    Rectal cancer Neg Hx    Stomach cancer Neg Hx     Prior to Admission medications   Medication Sig Start Date End Date Taking? Authorizing Provider  amLODipine  (NORVASC ) 5 MG tablet Take 7.5 mg by mouth at bedtime.   Yes [provider]  Multiple Vitamin (MULTIVITAMIN) tablet Take 1 tablet by mouth 3 (three) times a week.   Yes [provider]  rosuvastatin (CRESTOR)  20 MG tablet Take 20 mg by mouth daily. 01/10/21  Yes [provider]  telmisartan (MICARDIS) 80 MG tablet Take 80 mg by mouth daily.   Yes [provider]  loratadine (CLARITIN) 10 MG tablet Take 10 mg by mouth daily as needed (for seasonal allergies).    [provider]    Physical Exam: Constitutional: Moderately built and nourished. Vitals:   01/14/24 1856 01/14/24 2000 01/14/24 2030 01/14/24 2100  BP: (!) 144/91 (!) 143/88 136/89 123/80  Pulse: 79 74 89 88  Resp: 18 13 12 14   Temp: 98.7 F (37.1 C)     TempSrc:      SpO2: 100% 98% 100% 100%   Eyes: Anicteric no pallor. ENMT: No discharge from the ears eyes nose and mouth. Neck: No mass felt.  No neck rigidity. Respiratory: No rhonchi or crepitations. Cardiovascular: S1-S2 heard. Abdomen: Soft nontender bowel sounds present. Musculoskeletal: No edema. Skin: No rash. Neurologic: Alert awake oriented time place and person.  Moves all extremities 5 x 5.  No facial asymmetry tongue is midline pupils equal and reacting to light. Psychiatric: Appears normal.  Normal affect.   Labs on Admission: I have personally reviewed following labs and imaging studies  CBC: Recent Labs  Lab 01/14/24 1438 01/14/24 1640 01/14/24 1654  WBC 7.9 8.0  --  NEUTROABS  --  5.8  --   HGB 12.5* 12.5* 12.6*  HCT 37.4* 37.4* 37.0*  MCV 91.0 91.2  --   PLT 292 282  --    Basic Metabolic Panel: Recent Labs  Lab 01/14/24 1438 01/14/24 1654  NA 131* 132*  K 3.9 4.0  CL 100 98  CO2 20*  --   GLUCOSE 107* 93  BUN 15 16  CREATININE 0.82 0.90  CALCIUM 8.7*  --    GFR: CrCl cannot be calculated (Unknown ideal weight.). Liver Function Tests: Recent Labs  Lab 01/14/24 1438  AST 22  ALT 21  ALKPHOS 67  BILITOT 0.4  PROT 6.4*  ALBUMIN 3.8   No results for input(s): "LIPASE", "AMYLASE" in the last 168 hours. No results for input(s): "AMMONIA" in the last 168 hours. Coagulation Profile: Recent Labs  Lab  01/14/24 1640  INR 1.1   Cardiac Enzymes: No results for input(s): "CKTOTAL", "CKMB", "CKMBINDEX", "TROPONINI" in the last 168 hours. BNP (last 3 results) No results for input(s): "PROBNP" in the last 8760 hours. HbA1C: No results for input(s): "HGBA1C" in the last 72 hours. CBG: Recent Labs  Lab 01/14/24 1452  GLUCAP 99   Lipid Profile: No results for input(s): "CHOL", "HDL", "LDLCALC", "TRIG", "CHOLHDL", "LDLDIRECT" in the last 72 hours. Thyroid  Function Tests: No results for input(s): "TSH", "T4TOTAL", "FREET4", "T3FREE", "THYROIDAB" in the last 72 hours. Anemia Panel: No results for input(s): "VITAMINB12", "FOLATE", "FERRITIN", "TIBC", "IRON", "RETICCTPCT" in the last 72 hours. Urine analysis:    Component Value Date/Time   COLORURINE STRAW (A) 01/14/2024 1606   APPEARANCEUR CLEAR 01/14/2024 1606   LABSPEC 1.014 01/14/2024 1606   PHURINE 6.0 01/14/2024 1606   GLUCOSEU NEGATIVE 01/14/2024 1606   HGBUR NEGATIVE 01/14/2024 1606   BILIRUBINUR NEGATIVE 01/14/2024 1606   KETONESUR NEGATIVE 01/14/2024 1606   PROTEINUR NEGATIVE 01/14/2024 1606   NITRITE NEGATIVE 01/14/2024 1606   LEUKOCYTESUR NEGATIVE 01/14/2024 1606   Sepsis Labs: @LABRCNTIP (procalcitonin:4,lacticidven:4) )No results found for this or any previous visit (from the past 240 hours).   Radiological Exams on Admission: MR BRAIN W WO CONTRAST Result Date: 01/14/2024 CLINICAL DATA:  Acute neurologic deficit EXAM: MRI HEAD WITHOUT AND WITH CONTRAST TECHNIQUE: Multiplanar, multiecho pulse sequences of the brain and surrounding structures were obtained without and with intravenous contrast. CONTRAST:  8mL GADAVIST  GADOBUTROL  1 MMOL/ML IV SOLN COMPARISON:  11/12/2023 brain MRI FINDINGS: Brain: There is heterogeneous mass of the left occipital lobe with multifocal internal hemorrhage. There is a large amount of surrounding hyperintense T2-weighted signal. There is mass effect from the medially bulging left temporal lobe  on the left posterior surface of the midbrain. At the level of the foramina Monro, there is 6 mm of rightward midline shift. There is heterogeneous contrast enhancement mass which altogether measures approximately 6 point by 3.0 cm. The areas of enhancement are discontinuous but posterior left hemisphere. No abnormal contrast enhancement elsewhere in the brain. Masses worsened 3125 Vascular: Normal flow voids. Skull and upper cervical spine: Normal calvarium and skull base. Visualized upper cervical spine and soft tissues are normal. Sinuses/Orbits:No paranasal sinus fluid levels or advanced mucosal thickening. No mastoid or middle ear effusion. Normal orbits. IMPRESSION: 1. Heterogeneous mass of the left occipital lobe with multifocal internal hemorrhage and a large amount of surrounding hyperintense T2-weighted signal. This is most consistent with a high-grade glioma. 2. Mass effect from the medially bulging left temporal lobe on the left posterior surface of the midbrain. At the level of the  Foramina of Monro, there is 6 mm of rightward midline shift. Electronically Signed   By: Juanetta Nordmann M.D.   On: 01/14/2024 19:59   CT ANGIO HEAD NECK W WO CM Addendum Date: 01/14/2024 ADDENDUM REPORT: 01/14/2024 17:30 ADDENDUM: These results were called by telephone at the time of interpretation on 01/14/2024 at 5:02 pm to provider Dr. Leida Puna, who verbally acknowledged these results. Electronically Signed   By: Denny Flack M.D.   On: 01/14/2024 17:30   Result Date: 01/14/2024 CLINICAL DATA:  Neuro deficit, concern for stroke, speech difficulty. EXAM: CT ANGIOGRAPHY HEAD AND NECK WITH AND WITHOUT CONTRAST TECHNIQUE: Multidetector CT imaging of the head and neck was performed using the standard protocol during bolus administration of intravenous contrast. Multiplanar CT image reconstructions and MIPs were obtained to evaluate the vascular anatomy. Carotid stenosis measurements (when applicable) are obtained utilizing  NASCET criteria, using the distal internal carotid diameter as the denominator. RADIATION DOSE REDUCTION: This exam was performed according to the departmental dose-optimization program which includes automated exposure control, adjustment of the mA and/or kV according to patient size and/or use of iterative reconstruction technique. CONTRAST:  75mL OMNIPAQUE  IOHEXOL  350 MG/ML SOLN COMPARISON:  CT head 11/13/2023, CTA head 11/12/2023. FINDINGS: CT HEAD FINDINGS Brain: There is new significant vasogenic edema within the left temporal and left parietal lobes with partial extension into the lateral aspect of the left occipital lobe resulting in local mass effect and sulcal effacement throughout the left cerebral hemisphere. There is associated 7 mm rightward midline shift. Additional edema in the left occipital lobe near the site of prior hemorrhage without evidence of new hemorrhage. The basilar cisterns are patent. Posterior fossa without acute abnormality. Ventricles: Partial effacement of the left lateral ventricle. There is significant effacement of the left temporal horn and atrium. No hydrocephalus. Vascular: No hyperdense vessel. Skull: No acute or aggressive finding. Sinuses/orbits: The visualized paranasal sinuses are clear. Orbits are symmetric. Other: Mastoid air cells are clear. CTA NECK FINDINGS Aortic arch: Standard configuration of the aortic arch. Imaged portion shows no evidence of aneurysm or dissection. Mild atherosclerosis of the aortic arch. No significant stenosis of the major arch vessel origins. Pulmonary arteries: As permitted by contrast timing, there are no filling defects in the visualized pulmonary arteries. Subclavian arteries: The subclavian arteries are patent bilaterally. Right carotid system: No evidence of dissection, stenosis (50% or greater), or occlusion. Left carotid system: No evidence of dissection, stenosis (50% or greater), or occlusion. Vertebral arteries: Right vertebral  artery is dominant. The right vertebral artery is patent from the origin to the vertebrobasilar confluence. Non dominant left vertebral artery is patent from the origin to the intracranial segment and terminates at the origin of the left PICA. No evidence of dissection, stenosis (50% or greater), or occlusion. Skeleton: No acute or aggressive finding noted. Other neck: The visualized airway is patent. No cervical lymphadenopathy. Upper chest: Visualized lung apices are clear. Review of the MIP images confirms the above findings CTA HEAD FINDINGS ANTERIOR CIRCULATION: The intracranial ICAs are patent bilaterally. No significant stenosis, proximal occlusion, aneurysm, or vascular malformation. MCAs: The middle cerebral arteries are patent bilaterally. ACAs: The anterior cerebral arteries are patent bilaterally. POSTERIOR CIRCULATION: No significant stenosis, proximal occlusion, aneurysm, or vascular malformation. PCAs: Patent bilaterally. Fetal origin of the left PCA. The right PCA is primarily supplied by the posterior communicating artery with a small P1 segment noted. Pcomm: The posterior communicating arteries are visualized bilaterally. SCAs: The superior cerebellar arteries are patent bilaterally. Basilar  artery: Patent AICAs: Patent PICAs: Patent Vertebral arteries: As above. Venous sinuses: As permitted by contrast timing, patent. Anatomic variants: None Review of the MIP images confirms the above findings IMPRESSION: New vasogenic edema in the left temporal and parietal lobes with extension into lateral aspect of the left occipital lobe. Recommend MRI head with without contrast for further evaluation of possible underlying mass lesion. Associated mass effect and 7 mm rightward midline shift. Additional edema in the left occipital lobe at the site of prior hemorrhage. No evidence of acute hemorrhage. Recommend attention on MRI. No large vessel occlusion. No high-grade stenosis, aneurysm, or dissection of the  arteries in the head and neck. Aortic Atherosclerosis (ICD10-I70.0). Electronically Signed: By: Denny Flack M.D. On: 01/14/2024 16:59    EKG: Independently reviewed.  Normal sinus rhythm.  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  Presently on IV Decadron for the mass effect.  Awaiting further recommendation from neurosurgery.  Also discussed with neurology. Prior history of stroke and intraparenchymal hemorrhage.  Will continue statins. Hypertension on amlodipine  and ARB. Hyponatremia could be from SIADH.  Will closely monitor metabolic panel.  Check urine studies TSH and cortisol. Anemia appears to be new.  Check anemia panel.  Since patient has intracranial mass with possible glioma and mass effect will need close monitoring and more than 2 midnight stay.   DVT prophylaxis: SCDs.  Avoiding anticoagulation with possibility of intracranial bleed and also may need biopsy. Code Status: Full code. Family Communication: Family at the bedside. Disposition Plan: Medical floor. Consults called: Neurosurgery. Admission status: Observation.

## 2024-01-14 NOTE — ED Notes (Signed)
 CCMD called.

## 2024-01-14 NOTE — ED Triage Notes (Signed)
 Pt BIB by EMS for AMS. Per EMS, they were dispatched out by a friend who contacted the pt earlier via telephone. Pt altered on arrival to ED, unable to verbalize what brought him to the ED. Pt states he hasn't felt right x 2 days.

## 2024-01-14 NOTE — Consult Note (Signed)
 I was contacted by Pearletha Bouche regarding this patient.  He was seen 2 months ago in the ER and admitted by Neurology with a left occipital hemorrhage/stroke versus mass.  Dr. Cabbell was contacted and recommend the patient have a follow-up scan in 2 months.  By report the patient has had confusion.  He was brought to the ER via EMS today.  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  I discussed situation with Pearletha Bouche. This appears to be  a malignant-appearing tumor in a troublesome location.  I recommend the patient be admitted by the hospitalists and started on Decadron 6 mg IV q.6 hours.  I will inform Dr. Michale Age of the patient's readmission in the morning.  He can discuss the options including palliative treatment, empiric radiation / chemotherapy, versus surgery/biopsy.

## 2024-01-15 ENCOUNTER — Other Ambulatory Visit: Payer: Self-pay

## 2024-01-15 DIAGNOSIS — R4182 Altered mental status, unspecified: Secondary | ICD-10-CM | POA: Diagnosis present

## 2024-01-15 DIAGNOSIS — R900 Intracranial space-occupying lesion found on diagnostic imaging of central nervous system: Secondary | ICD-10-CM | POA: Diagnosis not present

## 2024-01-15 DIAGNOSIS — R410 Disorientation, unspecified: Secondary | ICD-10-CM | POA: Diagnosis not present

## 2024-01-15 DIAGNOSIS — K219 Gastro-esophageal reflux disease without esophagitis: Secondary | ICD-10-CM | POA: Diagnosis not present

## 2024-01-15 DIAGNOSIS — D72829 Elevated white blood cell count, unspecified: Secondary | ICD-10-CM | POA: Diagnosis not present

## 2024-01-15 DIAGNOSIS — Z79899 Other long term (current) drug therapy: Secondary | ICD-10-CM | POA: Diagnosis not present

## 2024-01-15 DIAGNOSIS — D630 Anemia in neoplastic disease: Secondary | ICD-10-CM | POA: Diagnosis not present

## 2024-01-15 DIAGNOSIS — H9190 Unspecified hearing loss, unspecified ear: Secondary | ICD-10-CM | POA: Diagnosis not present

## 2024-01-15 DIAGNOSIS — R262 Difficulty in walking, not elsewhere classified: Secondary | ICD-10-CM | POA: Diagnosis not present

## 2024-01-15 DIAGNOSIS — I10 Essential (primary) hypertension: Secondary | ICD-10-CM | POA: Diagnosis not present

## 2024-01-15 DIAGNOSIS — E222 Syndrome of inappropriate secretion of antidiuretic hormone: Secondary | ICD-10-CM | POA: Diagnosis not present

## 2024-01-15 DIAGNOSIS — G939 Disorder of brain, unspecified: Secondary | ICD-10-CM | POA: Diagnosis not present

## 2024-01-15 DIAGNOSIS — G936 Cerebral edema: Secondary | ICD-10-CM | POA: Diagnosis not present

## 2024-01-15 DIAGNOSIS — E871 Hypo-osmolality and hyponatremia: Secondary | ICD-10-CM

## 2024-01-15 DIAGNOSIS — G9389 Other specified disorders of brain: Secondary | ICD-10-CM

## 2024-01-15 DIAGNOSIS — E785 Hyperlipidemia, unspecified: Secondary | ICD-10-CM | POA: Diagnosis not present

## 2024-01-15 DIAGNOSIS — Z8673 Personal history of transient ischemic attack (TIA), and cerebral infarction without residual deficits: Secondary | ICD-10-CM | POA: Diagnosis not present

## 2024-01-15 DIAGNOSIS — Z515 Encounter for palliative care: Secondary | ICD-10-CM | POA: Diagnosis not present

## 2024-01-15 DIAGNOSIS — C714 Malignant neoplasm of occipital lobe: Secondary | ICD-10-CM | POA: Diagnosis not present

## 2024-01-15 LAB — VITAMIN B12: Vitamin B-12: 319 pg/mL (ref 180–914)

## 2024-01-15 LAB — CBC
HCT: 38.5 % — ABNORMAL LOW (ref 39.0–52.0)
Hemoglobin: 13.3 g/dL (ref 13.0–17.0)
MCH: 30.4 pg (ref 26.0–34.0)
MCHC: 34.5 g/dL (ref 30.0–36.0)
MCV: 87.9 fL (ref 80.0–100.0)
Platelets: 310 10*3/uL (ref 150–400)
RBC: 4.38 MIL/uL (ref 4.22–5.81)
RDW: 11.9 % (ref 11.5–15.5)
WBC: 10.9 10*3/uL — ABNORMAL HIGH (ref 4.0–10.5)
nRBC: 0 % (ref 0.0–0.2)

## 2024-01-15 LAB — FOLATE: Folate: 26.7 ng/mL

## 2024-01-15 LAB — IRON AND TIBC
Iron: 70 ug/dL (ref 45–182)
Saturation Ratios: 22 % (ref 17.9–39.5)
TIBC: 326 ug/dL (ref 250–450)
UIBC: 256 ug/dL

## 2024-01-15 LAB — COMPREHENSIVE METABOLIC PANEL WITH GFR
ALT: 17 U/L (ref 0–44)
AST: 19 U/L (ref 15–41)
Albumin: 3.5 g/dL (ref 3.5–5.0)
Alkaline Phosphatase: 72 U/L (ref 38–126)
Anion gap: 9 (ref 5–15)
BUN: 15 mg/dL (ref 8–23)
CO2: 20 mmol/L — ABNORMAL LOW (ref 22–32)
Calcium: 9 mg/dL (ref 8.9–10.3)
Chloride: 102 mmol/L (ref 98–111)
Creatinine, Ser: 0.75 mg/dL (ref 0.61–1.24)
GFR, Estimated: >60 mL/min (ref >60)
Glucose, Bld: 147 mg/dL — ABNORMAL HIGH (ref 70–99)
Potassium: 3.8 mmol/L (ref 3.5–5.1)
Sodium: 131 mmol/L — ABNORMAL LOW (ref 135–145)
Total Bilirubin: 0.8 mg/dL (ref 0.0–1.2)
Total Protein: 6.6 g/dL (ref 6.5–8.1)

## 2024-01-15 LAB — CORTISOL: Cortisol, Plasma: 2.6 ug/dL

## 2024-01-15 LAB — RETICULOCYTES
Immature Retic Fract: 3.5 % (ref 2.3–15.9)
RBC.: 4.31 MIL/uL (ref 4.22–5.81)
Retic Count, Absolute: 43.5 10*3/uL (ref 19.0–186.0)
Retic Ct Pct: 1 % (ref 0.4–3.1)

## 2024-01-15 LAB — FERRITIN: Ferritin: 90 ng/mL (ref 24–336)

## 2024-01-15 LAB — SODIUM, URINE, RANDOM: Sodium, Ur: 48 mmol/L

## 2024-01-15 LAB — OSMOLALITY, URINE: Osmolality, Ur: 533 mosm/kg (ref 300–900)

## 2024-01-15 LAB — TSH: TSH: 1.159 u[IU]/mL (ref 0.350–4.500)

## 2024-01-15 MED ORDER — IRBESARTAN 300 MG PO TABS
300.0000 mg | ORAL_TABLET | Freq: Every day | ORAL | Status: DC
  Administered 2024-01-15 – 2024-01-17 (×3): 300 mg via ORAL
  Filled 2024-01-15 (×3): qty 1

## 2024-01-15 MED ORDER — AMLODIPINE BESYLATE 5 MG PO TABS
7.5000 mg | ORAL_TABLET | Freq: Every day | ORAL | Status: DC
  Administered 2024-01-15 – 2024-01-16 (×2): 7.5 mg via ORAL
  Filled 2024-01-15 (×2): qty 1

## 2024-01-15 MED ORDER — ROSUVASTATIN CALCIUM 20 MG PO TABS
20.0000 mg | ORAL_TABLET | Freq: Every day | ORAL | Status: DC
  Administered 2024-01-15 – 2024-01-17 (×3): 20 mg via ORAL
  Filled 2024-01-15 (×3): qty 1

## 2024-01-15 MED ORDER — ENSURE ENLIVE PO LIQD
237.0000 mL | Freq: Two times a day (BID) | ORAL | Status: DC
  Administered 2024-01-16 – 2024-01-17 (×3): 237 mL via ORAL

## 2024-01-15 NOTE — Evaluation (Addendum)
 Physical Therapy Evaluation Patient Details Name: Seth Gonzalez MRN: [id number] DOB: 08/12/1951 Today's Date: 01/15/2024  History of Present Illness  Seth Gonzalez is a 73 y.o. male who has been having some difficulty talking last 2 days as noticed by family. 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  PMH c/o prior stroke, hypertension was admitted in March 2025 for left occipital intraparenchymal hemorrhage with possible mass.  Clinical Impression  Pt presents with admitting diagnosis above. Pt today was able to ambulate in hallway with no AD and navigate steps with CGA/Min A. Pt noted with an ataxic gait pattern with occasional scissoring however mostly able to self correct. Pt also noted with decreased visual scanning on R side as pt ran into obstacles in hallway on R side requiring Min A to correct. Pt main limitation currently are expressive aphasia and R visual field deficits. PTA pt sister reports he was mostly independent however recently has been using SPC. Recommend HHPT upon DC with RW. PT will continue to follow.         If plan is discharge home, recommend the following: A little help with walking and/or transfers;A little help with bathing/dressing/bathroom;Assistance with cooking/housework;Direct supervision/assist for medications management;Assist for transportation;Help with stairs or ramp for entrance;Supervision due to cognitive status   Can travel by private vehicle        Equipment Recommendations Rolling walker (2 wheels)  Recommendations for Other Services  Speech consult    Functional Status Assessment Patient has had a recent decline in their functional status and demonstrates the ability to make significant improvements in function in a reasonable and predictable amount of time.     Precautions / Restrictions Precautions Precautions: Fall Recall of Precautions/Restrictions: Impaired Restrictions Weight  Bearing Restrictions Per Provider Order: No      Mobility  Bed Mobility Overal bed mobility: Modified Independent             General bed mobility comments: HOB elevated    Transfers Overall transfer level: Needs assistance Equipment used: None Transfers: Sit to/from Stand Sit to Stand: Supervision           General transfer comment: no physical assist    Ambulation/Gait Ambulation/Gait assistance: Contact guard assist, Min assist Gait Distance (Feet): 350 Feet Assistive device: None Gait Pattern/deviations: Scissoring, Trunk flexed, Narrow base of support, Drifts right/left, Decreased stride length, Step-through pattern, Ataxic Gait velocity: functional Gait velocity interpretation: >2.62 ft/sec, indicative of community ambulatory   General Gait Details: Pt noted with an ataxic gait pattern with occasional scissoring however mostly able to self correct. Pt also noted with decreased visual scanning on R side as pt ran into obstacles in hallway on R side requiring Min A to correct.  Stairs Stairs: Yes Stairs assistance: Contact guard assist Stair Management: Two rails, Alternating pattern, Forwards Number of Stairs: 2 General stair comments: no LOB noted  Wheelchair Mobility     Tilt Bed    Modified Rankin (Stroke Patients Only)       Balance Overall balance assessment: Mild deficits observed, not formally tested                                           Pertinent Vitals/Pain Pain Assessment Pain Assessment: No/denies pain    Home Living Family/patient expects to be discharged to:: Private residence Living  Arrangements: Alone Available Help at Discharge: Friend(s);Available PRN/intermittently Type of Home: House Home Access: Stairs to enter Entrance Stairs-Rails: None Entrance Stairs-Number of Steps: 3-4 Alternate Level Stairs-Number of Steps: flight Home Layout: Multi-level;Able to live on main level with  bedroom/bathroom Home Equipment: Jeananne Mighty - single point;Shower seat      Prior Function Prior Level of Function : Independent/Modified Independent;Driving             Mobility Comments: Sister states that recently pt has been using SPC ADLs Comments: independent     Extremity/Trunk Assessment   Upper Extremity Assessment Upper Extremity Assessment: Overall WFL for tasks assessed    Lower Extremity Assessment Lower Extremity Assessment: Overall WFL for tasks assessed    Cervical / Trunk Assessment Cervical / Trunk Assessment: Kyphotic  Communication   Communication Communication: Impaired Factors Affecting Communication: Difficulty expressing self    Cognition Arousal: Alert Behavior During Therapy: WFL for tasks assessed/performed   PT - Cognitive impairments: Difficult to assess Difficult to assess due to: Impaired communication                     PT - Cognition Comments: Pt with expressive aphasic. Following commands: Impaired Following commands impaired: Follows one step commands inconsistently, Follows one step commands with increased time     Cueing Cueing Techniques: Verbal cues, Gestural cues, Tactile cues     General Comments General comments (skin integrity, edema, etc.): VSS    Exercises     Assessment/Plan    PT Assessment Patient needs continued PT services  PT Problem List Decreased strength;Decreased range of motion;Decreased activity tolerance;Decreased balance;Decreased mobility;Decreased coordination;Decreased cognition;Decreased knowledge of use of DME;Decreased knowledge of precautions;Decreased safety awareness       PT Treatment Interventions DME instruction;Gait training;Stair training;Functional mobility training;Therapeutic activities;Therapeutic exercise;Balance training;Neuromuscular re-education;Cognitive remediation;Patient/family education    PT Goals (Current goals can be found in the Care Plan section)  Acute Rehab PT  Goals Patient Stated Goal: to get better PT Goal Formulation: With patient Time For Goal Achievement: 01/29/24 Potential to Achieve Goals: Good    Frequency Min 3X/week     Co-evaluation               AM-PAC PT "6 Clicks" Mobility  Outcome Measure Help needed turning from your back to your side while in a flat bed without using bedrails?: None Help needed moving from lying on your back to sitting on the side of a flat bed without using bedrails?: None Help needed moving to and from a bed to a chair (including a wheelchair)?: A Little Help needed standing up from a chair using your arms (e.g., wheelchair or bedside chair)?: A Little Help needed to walk in hospital room?: A Little Help needed climbing 3-5 steps with a railing? : A Little 6 Click Score: 20    End of Session Equipment Utilized During Treatment: Gait belt Activity Tolerance: Patient tolerated treatment well Patient left: in bed;with call bell/phone within reach;with bed alarm set;with family/visitor present Nurse Communication: Mobility status PT Visit Diagnosis: Other abnormalities of gait and mobility (R26.89)    Time: 1610-9604 PT Time Calculation (min) (ACUTE ONLY): 25 min   Charges:   PT Evaluation $PT Eval Moderate Complexity: 1 Mod PT Treatments $Gait Training: 8-22 mins PT General Charges $$ ACUTE PT VISIT: 1 Visit         Rodgers Clack, PT, DPT Acute Rehab Services [phone]   Mekala Winger 01/15/2024, 10:52 AM

## 2024-01-15 NOTE — H&P (Incomplete)
 History and Physical    Seth Gonzalez QMV:[id number] DOB: 08-27-1951 DOA: 01/14/2024  Patient coming from: Home.  Chief Complaint: Difficulty talking.  HPI: Seth Gonzalez is a 73 y.o. male with c/o prior stroke, hypertension was admitted in March 2025 for left occipital intraparenchymal hemorrhage with possible mass has been having some difficulty talking last 2 days as noticed by family.  Patient was finding it difficult to bring out words.  Patient states he is also has been having some difficulty walking with unstable gait.  Denies any headache nausea vomiting.  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  ER physician discussed with on-call neurosurgeon Dr. Larrie Po who advised patient to be started on IV steroids and Dr. Cabbell will be seeing patient in consult.  Labs show anemia and sodium of 131.  Review of Systems: As per HPI, rest all negative.   Past Medical History:  Diagnosis Date  . Hearing loss   . Hypertension     Past Surgical History:  Procedure Laterality Date  . APPENDECTOMY    . CATARACT EXTRACTION, BILATERAL    . COLONOSCOPY  2009  . DENTAL SURGERY    . NASAL SEPTUM SURGERY       reports that he has never smoked. He has never used smokeless tobacco. He reports that he does not drink alcohol  and does not use drugs.  No Known Allergies  Family History  Problem Relation Age of Onset  . Dementia Maternal Aunt   . Dementia Paternal Uncle   . Colon cancer Neg Hx   . Colon polyps Neg Hx   . Esophageal cancer Neg Hx   . Rectal cancer Neg Hx   . Stomach cancer Neg Hx     Prior to Admission medications   Medication Sig Start Date End Date Taking? Authorizing Provider  amLODipine  (NORVASC ) 5 MG tablet Take 7.5 mg by mouth at bedtime.   Yes [provider]  Multiple Vitamin (MULTIVITAMIN) tablet Take 1 tablet by mouth 3 (three) times a week.   Yes [provider]   rosuvastatin (CRESTOR) 20 MG tablet Take 20 mg by mouth daily. 01/10/21  Yes [provider]  telmisartan (MICARDIS) 80 MG tablet Take 80 mg by mouth daily.   Yes [provider]  loratadine (CLARITIN) 10 MG tablet Take 10 mg by mouth daily as needed (for seasonal allergies).    [provider]    Physical Exam: Constitutional: *** Vitals:   01/14/24 1856 01/14/24 2000 01/14/24 2030 01/14/24 2100  BP: (!) 144/91 (!) 143/88 136/89 123/80  Pulse: 79 74 89 88  Resp: 18 13 12 14   Temp: 98.7 F (37.1 C)     TempSrc:      SpO2: 100% 98% 100% 100%   Eyes: *** ENMT: *** Neck: *** Respiratory: *** Cardiovascular: *** Abdomen: *** Musculoskeletal: *** Skin: *** Neurologic:*** Psychiatric: ***   Labs on Admission: I have personally reviewed following labs and imaging studies  CBC: Recent Labs  Lab 01/14/24 1438 01/14/24 1640 01/14/24 1654  WBC 7.9 8.0  --   NEUTROABS  --  5.8  --   HGB 12.5* 12.5* 12.6*  HCT 37.4* 37.4* 37.0*  MCV 91.0 91.2  --   PLT 292 282  --    Basic Metabolic Panel: Recent Labs  Lab 01/14/24 1438 01/14/24 1654  NA 131* 132*  K 3.9 4.0  CL 100 98  CO2 20*  --  GLUCOSE 107* 93  BUN 15 16  CREATININE 0.82 0.90  CALCIUM 8.7*  --    GFR: CrCl cannot be calculated (Unknown ideal weight.). Liver Function Tests: Recent Labs  Lab 01/14/24 1438  AST 22  ALT 21  ALKPHOS 67  BILITOT 0.4  PROT 6.4*  ALBUMIN 3.8   No results for input(s): "LIPASE", "AMYLASE" in the last 168 hours. No results for input(s): "AMMONIA" in the last 168 hours. Coagulation Profile: Recent Labs  Lab 01/14/24 1640  INR 1.1   Cardiac Enzymes: No results for input(s): "CKTOTAL", "CKMB", "CKMBINDEX", "TROPONINI" in the last 168 hours. BNP (last 3 results) No results for input(s): "PROBNP" in the last 8760 hours. HbA1C: No results for input(s): "HGBA1C" in the last 72 hours. CBG: Recent Labs  Lab 01/14/24 1452  GLUCAP 99   Lipid  Profile: No results for input(s): "CHOL", "HDL", "LDLCALC", "TRIG", "CHOLHDL", "LDLDIRECT" in the last 72 hours. Thyroid  Function Tests: No results for input(s): "TSH", "T4TOTAL", "FREET4", "T3FREE", "THYROIDAB" in the last 72 hours. Anemia Panel: No results for input(s): "VITAMINB12", "FOLATE", "FERRITIN", "TIBC", "IRON", "RETICCTPCT" in the last 72 hours. Urine analysis:    Component Value Date/Time   COLORURINE STRAW (A) 01/14/2024 1606   APPEARANCEUR CLEAR 01/14/2024 1606   LABSPEC 1.014 01/14/2024 1606   PHURINE 6.0 01/14/2024 1606   GLUCOSEU NEGATIVE 01/14/2024 1606   HGBUR NEGATIVE 01/14/2024 1606   BILIRUBINUR NEGATIVE 01/14/2024 1606   KETONESUR NEGATIVE 01/14/2024 1606   PROTEINUR NEGATIVE 01/14/2024 1606   NITRITE NEGATIVE 01/14/2024 1606   LEUKOCYTESUR NEGATIVE 01/14/2024 1606   Sepsis Labs: @LABRCNTIP (procalcitonin:4,lacticidven:4) )No results found for this or any previous visit (from the past 240 hours).   Radiological Exams on Admission: MR BRAIN W WO CONTRAST Result Date: 01/14/2024 CLINICAL DATA:  Acute neurologic deficit EXAM: MRI HEAD WITHOUT AND WITH CONTRAST TECHNIQUE: Multiplanar, multiecho pulse sequences of the brain and surrounding structures were obtained without and with intravenous contrast. CONTRAST:  8mL GADAVIST  GADOBUTROL  1 MMOL/ML IV SOLN COMPARISON:  11/12/2023 brain MRI FINDINGS: Brain: There is heterogeneous mass of the left occipital lobe with multifocal internal hemorrhage. There is a large amount of surrounding hyperintense T2-weighted signal. There is mass effect from the medially bulging left temporal lobe on the left posterior surface of the midbrain. At the level of the foramina Monro, there is 6 mm of rightward midline shift. There is heterogeneous contrast enhancement mass which altogether measures approximately 6 point by 3.0 cm. The areas of enhancement are discontinuous but posterior left hemisphere. No abnormal contrast enhancement  elsewhere in the brain. Masses worsened 3125 Vascular: Normal flow voids. Skull and upper cervical spine: Normal calvarium and skull base. Visualized upper cervical spine and soft tissues are normal. Sinuses/Orbits:No paranasal sinus fluid levels or advanced mucosal thickening. No mastoid or middle ear effusion. Normal orbits. IMPRESSION: 1. Heterogeneous mass of the left occipital lobe with multifocal internal hemorrhage and a large amount of surrounding hyperintense T2-weighted signal. This is most consistent with a high-grade glioma. 2. Mass effect from the medially bulging left temporal lobe on the left posterior surface of the midbrain. At the level of the Foramina of Monro, there is 6 mm of rightward midline shift. Electronically Signed   By: Juanetta Nordmann M.D.   On: 01/14/2024 19:59   CT ANGIO HEAD NECK W WO CM Addendum Date: 01/14/2024 ADDENDUM REPORT: 01/14/2024 17:30 ADDENDUM: These results were called by telephone at the time of interpretation on 01/14/2024 at 5:02 pm to provider Dr. Leida Puna, who verbally  acknowledged these results. Electronically Signed   By: Denny Flack M.D.   On: 01/14/2024 17:30   Result Date: 01/14/2024 CLINICAL DATA:  Neuro deficit, concern for stroke, speech difficulty. EXAM: CT ANGIOGRAPHY HEAD AND NECK WITH AND WITHOUT CONTRAST TECHNIQUE: Multidetector CT imaging of the head and neck was performed using the standard protocol during bolus administration of intravenous contrast. Multiplanar CT image reconstructions and MIPs were obtained to evaluate the vascular anatomy. Carotid stenosis measurements (when applicable) are obtained utilizing NASCET criteria, using the distal internal carotid diameter as the denominator. RADIATION DOSE REDUCTION: This exam was performed according to the departmental dose-optimization program which includes automated exposure control, adjustment of the mA and/or kV according to patient size and/or use of iterative reconstruction technique.  CONTRAST:  75mL OMNIPAQUE  IOHEXOL  350 MG/ML SOLN COMPARISON:  CT head 11/13/2023, CTA head 11/12/2023. FINDINGS: CT HEAD FINDINGS Brain: There is new significant vasogenic edema within the left temporal and left parietal lobes with partial extension into the lateral aspect of the left occipital lobe resulting in local mass effect and sulcal effacement throughout the left cerebral hemisphere. There is associated 7 mm rightward midline shift. Additional edema in the left occipital lobe near the site of prior hemorrhage without evidence of new hemorrhage. The basilar cisterns are patent. Posterior fossa without acute abnormality. Ventricles: Partial effacement of the left lateral ventricle. There is significant effacement of the left temporal horn and atrium. No hydrocephalus. Vascular: No hyperdense vessel. Skull: No acute or aggressive finding. Sinuses/orbits: The visualized paranasal sinuses are clear. Orbits are symmetric. Other: Mastoid air cells are clear. CTA NECK FINDINGS Aortic arch: Standard configuration of the aortic arch. Imaged portion shows no evidence of aneurysm or dissection. Mild atherosclerosis of the aortic arch. No significant stenosis of the major arch vessel origins. Pulmonary arteries: As permitted by contrast timing, there are no filling defects in the visualized pulmonary arteries. Subclavian arteries: The subclavian arteries are patent bilaterally. Right carotid system: No evidence of dissection, stenosis (50% or greater), or occlusion. Left carotid system: No evidence of dissection, stenosis (50% or greater), or occlusion. Vertebral arteries: Right vertebral artery is dominant. The right vertebral artery is patent from the origin to the vertebrobasilar confluence. Non dominant left vertebral artery is patent from the origin to the intracranial segment and terminates at the origin of the left PICA. No evidence of dissection, stenosis (50% or greater), or occlusion. Skeleton: No acute or  aggressive finding noted. Other neck: The visualized airway is patent. No cervical lymphadenopathy. Upper chest: Visualized lung apices are clear. Review of the MIP images confirms the above findings CTA HEAD FINDINGS ANTERIOR CIRCULATION: The intracranial ICAs are patent bilaterally. No significant stenosis, proximal occlusion, aneurysm, or vascular malformation. MCAs: The middle cerebral arteries are patent bilaterally. ACAs: The anterior cerebral arteries are patent bilaterally. POSTERIOR CIRCULATION: No significant stenosis, proximal occlusion, aneurysm, or vascular malformation. PCAs: Patent bilaterally. Fetal origin of the left PCA. The right PCA is primarily supplied by the posterior communicating artery with a small P1 segment noted. Pcomm: The posterior communicating arteries are visualized bilaterally. SCAs: The superior cerebellar arteries are patent bilaterally. Basilar artery: Patent AICAs: Patent PICAs: Patent Vertebral arteries: As above. Venous sinuses: As permitted by contrast timing, patent. Anatomic variants: None Review of the MIP images confirms the above findings IMPRESSION: New vasogenic edema in the left temporal and parietal lobes with extension into lateral aspect of the left occipital lobe. Recommend MRI head with without contrast for further evaluation of possible underlying mass  lesion. Associated mass effect and 7 mm rightward midline shift. Additional edema in the left occipital lobe at the site of prior hemorrhage. No evidence of acute hemorrhage. Recommend attention on MRI. No large vessel occlusion. No high-grade stenosis, aneurysm, or dissection of the arteries in the head and neck. Aortic Atherosclerosis (ICD10-I70.0). Electronically Signed: By: Denny Flack M.D. On: 01/14/2024 16:59    EKG: Independently reviewed. ***  Assessment/Plan Principal Problem:   Intracranial mass Active Problems:   Essential hypertension   Anemia   Brain mass    ***   DVT prophylaxis:  *** Code Status: ***  Family Communication: ***  Disposition Plan: ***  Consults called: ***  Admission status: ***

## 2024-01-15 NOTE — Care Management (Signed)
 Transition of Care [facility]) - Inpatient Brief Assessment   Patient Details  Name: Seth Gonzalez MRN: [id number] Date of Birth: August 27, 1951  Transition of [facility]) CM/SW Contact:    Ronni Colace, RN Phone Number: 01/15/2024, 1:12 PM   Clinical Narrative: Patient from [name], was having some neurological dysfuntion and trouble ambulating in the last  couple of weeks. Has assistance from [name], family and friends. Presented to the ED with AMS. Brain mass, still working up unsure of interventions yet. OT has evaluated patient.  TOC will fallow for needs, recommendations, and transitions of care  Transition of Care Asessment: Insurance and Status: Insurance coverage has been reviewed Patient has primary care physician: Yes Home environment has been reviewed: [facility] Prior level of function:: Independent Prior/Current Home Services: No current home services Social Drivers of Health Review: SDOH reviewed no interventions necessary Readmission risk has been reviewed: Yes Transition of care needs: transition of care needs identified, TOC will continue to follow

## 2024-01-15 NOTE — Progress Notes (Signed)
 Patient ID: Seth Gonzalez, male   DOB: 04/02/1951, 73 y.o.   MRN: [id number] BP (!) 146/83 (BP Location: Left Arm)   Pulse 93   Temp 98.2 F (36.8 C) (Oral)   Resp 14   Ht 6\' 3"  (1.905 m)   Wt 81.6 kg   SpO2 95%   BMI 22.50 kg/m  Had very long discussion with sister, sister in law, and patient. He does not wish any surgery. May be open to radiation or chemo, but he is not set.  Would like to be kept comfortable. Should remain on decadron, and keppra.  We should engage palliative care to arrange for home hospice.

## 2024-01-15 NOTE — Progress Notes (Signed)
 Received pt from ED via stretcher, placed on tele monitoring, oriented to room and surroundings, keep call bell within reach.

## 2024-01-15 NOTE — Plan of Care (Signed)
  Problem: Education: Goal: Knowledge of General Education information will improve Description: Including pain rating scale, medication(s)/side effects and non-pharmacologic comfort measures Outcome: Progressing   Problem: Clinical Measurements: Goal: Will remain free from infection Outcome: Progressing   Problem: Nutrition: Goal: Adequate nutrition will be maintained Outcome: Progressing   Problem: Pain Managment: Goal: General experience of comfort will improve and/or be controlled Outcome: Progressing   Problem: Safety: Goal: Ability to remain free from injury will improve Outcome: Progressing   Problem: Coping: Goal: Level of anxiety will decrease Outcome: Progressing

## 2024-01-15 NOTE — Evaluation (Signed)
 Speech Language Pathology Evaluation Patient Details Name: Seth Gonzalez MRN: [id number] DOB: [birth year] Today's Date: 01/15/2024 Time: 5956-3875 SLP Time Calculation (min) (ACUTE ONLY): 13 min  Problem List:  Patient Active Problem List   Diagnosis Date Noted   Intracranial mass 01/14/2024   Essential hypertension 01/14/2024   Anemia 01/14/2024   Brain mass 01/14/2024   Intracranial hemorrhage (HCC) 11/12/2023   Cerebrovascular accident (CVA) (HCC) 01/16/2021   Past Medical History:  Past Medical History:  Diagnosis Date   Hearing loss    Hypertension    Past Surgical History:  Past Surgical History:  Procedure Laterality Date   APPENDECTOMY     CATARACT EXTRACTION, BILATERAL     COLONOSCOPY  2009   DENTAL SURGERY     NASAL SEPTUM SURGERY     HPI:  Seth Gonzalez is a 73 y.o. male with c/o prior stroke, hypertension was admitted in March 2025 for left occipital intraparenchymal hemorrhage with possible mass has been having some difficulty talking and walking last 2 days as noticed by family.  MRI revealed: heterogenous mass of the left occipital lobe with multifocal internal hemorrhage most consistent with high-grade glioma.   Assessment / Plan / Recommendation Clinical Impression  Patient presents with significant expressive language impairment secondary to newly diagnosed glioma. Patient's sister in law present at bedside. She is a strong advocate for patient and assists with communication during periods of language breakdown. Patient was in normal state until 2 weeks ago, when suddently overnight he lost the ability to effectively communicate. Upon arrival to hospital, he was diagnosed with glioma and given 3 months to live. Patient's expressive language is characterized by intact social response and often fluent strings of 3-4 words, however speech is often empty and word finding is severely impaired. Patient able to repeat names of items when given a model but unable to select  name of object from field of 3. Patient endorses no changes to cognition and no deficits receptively as evidenced by accurate and meaningful responses as able during patient interview. Given patient's prognosis, he states he would not like to partake in any intensive therapy, but is open and willing to learn about strategies that would be beneficial for communication as aphasia progresses alongside brain tumor. Patient would benefit from continued SLP services to target provision of aforementioned strategies and provide care partner training prior to discharge.    SLP Assessment  SLP Recommendation/Assessment: Patient needs continued Speech Lanaguage Pathology Services SLP Visit Diagnosis: Aphasia (R47.01)    Recommendations for follow up therapy are one component of a multi-disciplinary discharge planning process, led by the attending physician.  Recommendations may be updated based on patient status, additional functional criteria and insurance authorization.    Follow Up Recommendations  Follow physician's recommendations for discharge plan and follow up therapies    Assistance Recommended at Discharge  Frequent or constant Supervision/Assistance  Functional Status Assessment Patient has had a recent decline in their functional status and/or demonstrates limited ability to make significant improvements in function in a reasonable and predictable amount of time  Frequency and Duration min 2x/week  1 week      SLP Evaluation Cognition  Overall Cognitive Status: Within Functional Limits for tasks assessed       Comprehension  Auditory Comprehension Overall Auditory Comprehension: Appears within functional limits for tasks assessed    Expression Expression Primary Mode of Expression: Verbal Verbal Expression Overall Verbal Expression: Impaired Initiation: No impairment Automatic Speech: Social Response;Counting;Day of week;Month of year  Level of Generative/Spontaneous Verbalization:  Phrase Repetition: Impaired Level of Impairment: Phrase level Naming: Impairment Responsive: 0-25% accurate Confrontation: Impaired Convergent: 0-24% accurate Divergent: 0-24% accurate Verbal Errors: Aware of errors Pragmatics: No impairment Effective Techniques: Sentence completion;Semantic cues;Phonemic cues;Articulatory cues Non-Verbal Means of Communication: Gestures Other Verbal Expression Comments: halting speech Written Expression Dominant Hand: Right Written Expression: Not tested   Oral / Motor  Oral Motor/Sensory Function Overall Oral Motor/Sensory Function: Within functional limits Motor Speech Overall Motor Speech: Appears within functional limits for tasks assessed Intelligibility: Intelligible           Dorla Gartner, M.A., CCC-SLP  Ladajah Soltys A Sindee Stucker 01/15/2024, 3:46 PM

## 2024-01-15 NOTE — Progress Notes (Signed)
 [facility]                                                                               [name], is a 73 y.o. male, DOB - 09-Mar-1951, HYQ:[id number] Admit date - 01/14/2024    Outpatient Primary MD for the patient is Tisovec, Kristina Pfeiffer, MD  LOS - 0  days    Brief summary   Seth Gonzalez is a 74 y.o. male with c/o prior stroke, hypertension was admitted in March 2025 for left occipital intraparenchymal hemorrhage with possible mass has been having some difficulty talking last 2 days as noticed by family.  Patient was finding it difficult to bring out words.  Patient states he is also has been having some difficulty walking with unstable gait.   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 ER physician discussed with on-call neurosurgeon Dr. Larrie Po who advised patient to be started on IV steroids and Dr. Cabbell will be seeing patient in consult.      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  NS on board, awaiting recommendations.  No headache or dizziness.  He appears to be back to baseline.     Hypertension Well controlled.  Resume norvasc  7.5 mg daily. And irbesartan  300 mg daily.     Hyperlipidemia Resume crestor 20 mg daily.    GERD Stable.    H/o intraparenchymal hemorrhage/ stroke Continue with statin.    Mild hyponatremia From [facility]?  TSH wnl. Urine sodium is 48.  Am cortisol is low.   Mild leukocytosis Monitor.  UA IS NEGATIVE.    Estimated body mass index is 23.25 kg/m as calculated from the following:   Height as of 11/25/23: 6\' 3"  (1.905 m).   Weight as of 11/25/23: 84.4 kg.  Code Status: full code.  DVT Prophylaxis:  SCD's Start: 01/14/24 2206   Level of Care: Level of care: Telemetry Medical Family Communication: Updated patient's family at bedside.  Disposition Plan:     Remains  inpatient appropriate:  pending NS recommendations.   Procedures:  None   Consultants:   Neurosurgery Neurology.   Antimicrobials:   Anti-infectives (From admission, onward)    None        Medications  Scheduled Meds:  amLODipine   7.5 mg Oral QHS   dexamethasone (DECADRON) injection  6 mg Intravenous Q6H   irbesartan   300 mg Oral Daily   pantoprazole   40 mg Oral Daily   rosuvastatin  20 mg Oral Daily   Continuous Infusions: PRN Meds:.acetaminophen  **OR** acetaminophen  (TYLENOL ) oral liquid 160 mg/5 mL **OR** acetaminophen , senna-docusate    Subjective:   Seth Gonzalez was seen and examined today.  No chest pain or sob. No nausea or vomiting or headache.   Objective:   Vitals:   01/14/24 2330 01/14/24 2350 01/15/24 0342 01/15/24 0753  BP: 126/80 (!) 154/86 138/82 (!) 146/83  Pulse: 71 95 81 93  Resp: 12 18  14   Temp: 98.4 F (36.9 C) 97.9 F (36.6 C) 97.8 F (36.6 C) 98.2 F (36.8 C)  TempSrc:  Oral Axillary Oral  SpO2: 98%  98% 95%    Intake/Output Summary (Last 24 hours) at 01/15/2024 0853 Last data filed at 01/14/2024 2123 Gross per 24 hour  Intake --  Output 1000 ml  Net -1000 ml   There were no vitals filed for this visit.   Exam General: Alert and oriented x 3, NAD Cardiovascular: S1 S2 auscultated, no murmurs, RRR Respiratory: Clear to auscultation bilaterally, no wheezing, rales or rhonchi Gastrointestinal: Soft, nontender, nondistended, + bowel sounds Ext: no pedal edema bilaterally Neuro: AAOx3, Skin: No rashes Psych: Normal affect and demeanor, alert and oriented x3    Data Reviewed:  I have personally reviewed following labs and imaging studies   CBC Lab Results  Component Value Date   WBC 10.9 (H) 01/15/2024   RBC 4.38 01/15/2024   RBC 4.31 01/15/2024   HGB 13.3 01/15/2024   HCT 38.5 (L) 01/15/2024   MCV 87.9 01/15/2024   MCH 30.4 01/15/2024   PLT 310 01/15/2024   MCHC 34.5 01/15/2024   RDW 11.9 01/15/2024   LYMPHSABS  1.4 01/14/2024   MONOABS 0.6 01/14/2024   EOSABS 0.1 01/14/2024   BASOSABS 0.1 01/14/2024     Last metabolic panel Lab Results  Component Value Date   NA 132 (L) 01/14/2024   K 4.0 01/14/2024   CL 98 01/14/2024   CO2 20 (L) 01/14/2024   BUN 16 01/14/2024   CREATININE 0.90 01/14/2024   GLUCOSE 93 01/14/2024   GFRNONAA >60 01/14/2024   CALCIUM 8.7 (L) 01/14/2024   PROT 6.4 (L) 01/14/2024   ALBUMIN 3.8 01/14/2024   BILITOT 0.4 01/14/2024   ALKPHOS 67 01/14/2024   AST 22 01/14/2024   ALT 21 01/14/2024   ANIONGAP 11 01/14/2024    CBG (last 3)  Recent Labs    01/14/24 1452  GLUCAP 99      Coagulation Profile: Recent Labs  Lab 01/14/24 1640  INR 1.1     Radiology Studies: MR BRAIN W WO CONTRAST Result Date: 01/14/2024 CLINICAL DATA:  Acute neurologic deficit EXAM: MRI HEAD WITHOUT AND WITH CONTRAST TECHNIQUE: Multiplanar, multiecho pulse sequences of the brain and surrounding structures were obtained without and with intravenous contrast. CONTRAST:  8mL GADAVIST  GADOBUTROL  1 MMOL/ML IV SOLN COMPARISON:  11/12/2023 brain MRI FINDINGS: Brain: There is heterogeneous mass of the left occipital lobe with multifocal internal hemorrhage. There is a large amount of surrounding hyperintense T2-weighted signal. There is mass effect from the medially bulging left temporal lobe on the left posterior surface of the midbrain. At the level of the foramina Monro, there is 6 mm of rightward midline shift. There is heterogeneous contrast enhancement mass which altogether measures approximately 6 point by 3.0 cm. The areas of enhancement are discontinuous but posterior left hemisphere. No abnormal contrast enhancement elsewhere in the brain. Masses worsened 3125 Vascular: Normal flow voids. Skull and upper cervical spine: Normal calvarium and skull base. Visualized upper cervical spine and soft tissues are normal. Sinuses/Orbits:No paranasal sinus fluid levels or advanced mucosal thickening. No  mastoid or middle ear effusion. Normal orbits. IMPRESSION: 1. Heterogeneous mass of the left occipital lobe with multifocal internal hemorrhage and a large amount of surrounding hyperintense T2-weighted signal. This is most consistent with a high-grade glioma. 2. Mass effect from the medially bulging left temporal lobe on the left posterior surface of the midbrain. At the level of the Foramina of Monro, there is 6 mm of rightward midline shift. Electronically Signed   By: Juanetta Nordmann  M.D.   On: 01/14/2024 19:59   CT ANGIO HEAD NECK W WO CM Addendum Date: 01/14/2024 ADDENDUM REPORT: 01/14/2024 17:30 ADDENDUM: These results were called by telephone at the time of interpretation on 01/14/2024 at 5:02 pm to provider Dr. Leida Puna, who verbally acknowledged these results. Electronically Signed   By: Denny Flack M.D.   On: 01/14/2024 17:30   Result Date: 01/14/2024 CLINICAL DATA:  Neuro deficit, concern for stroke, speech difficulty. EXAM: CT ANGIOGRAPHY HEAD AND NECK WITH AND WITHOUT CONTRAST TECHNIQUE: Multidetector CT imaging of the head and neck was performed using the standard protocol during bolus administration of intravenous contrast. Multiplanar CT image reconstructions and MIPs were obtained to evaluate the vascular anatomy. Carotid stenosis measurements (when applicable) are obtained utilizing NASCET criteria, using the distal internal carotid diameter as the denominator. RADIATION DOSE REDUCTION: This exam was performed according to the departmental dose-optimization program which includes automated exposure control, adjustment of the mA and/or kV according to patient size and/or use of iterative reconstruction technique. CONTRAST:  75mL OMNIPAQUE  IOHEXOL  350 MG/ML SOLN COMPARISON:  CT head 11/13/2023, CTA head 11/12/2023. FINDINGS: CT HEAD FINDINGS Brain: There is new significant vasogenic edema within the left temporal and left parietal lobes with partial extension into the lateral aspect of the left  occipital lobe resulting in local mass effect and sulcal effacement throughout the left cerebral hemisphere. There is associated 7 mm rightward midline shift. Additional edema in the left occipital lobe near the site of prior hemorrhage without evidence of new hemorrhage. The basilar cisterns are patent. Posterior fossa without acute abnormality. Ventricles: Partial effacement of the left lateral ventricle. There is significant effacement of the left temporal horn and atrium. No hydrocephalus. Vascular: No hyperdense vessel. Skull: No acute or aggressive finding. Sinuses/orbits: The visualized paranasal sinuses are clear. Orbits are symmetric. Other: Mastoid air cells are clear. CTA NECK FINDINGS Aortic arch: Standard configuration of the aortic arch. Imaged portion shows no evidence of aneurysm or dissection. Mild atherosclerosis of the aortic arch. No significant stenosis of the major arch vessel origins. Pulmonary arteries: As permitted by contrast timing, there are no filling defects in the visualized pulmonary arteries. Subclavian arteries: The subclavian arteries are patent bilaterally. Right carotid system: No evidence of dissection, stenosis (50% or greater), or occlusion. Left carotid system: No evidence of dissection, stenosis (50% or greater), or occlusion. Vertebral arteries: Right vertebral artery is dominant. The right vertebral artery is patent from the origin to the vertebrobasilar confluence. Non dominant left vertebral artery is patent from the origin to the intracranial segment and terminates at the origin of the left PICA. No evidence of dissection, stenosis (50% or greater), or occlusion. Skeleton: No acute or aggressive finding noted. Other neck: The visualized airway is patent. No cervical lymphadenopathy. Upper chest: Visualized lung apices are clear. Review of the MIP images confirms the above findings CTA HEAD FINDINGS ANTERIOR CIRCULATION: The intracranial ICAs are patent bilaterally. No  significant stenosis, proximal occlusion, aneurysm, or vascular malformation. MCAs: The middle cerebral arteries are patent bilaterally. ACAs: The anterior cerebral arteries are patent bilaterally. POSTERIOR CIRCULATION: No significant stenosis, proximal occlusion, aneurysm, or vascular malformation. PCAs: Patent bilaterally. Fetal origin of the left PCA. The right PCA is primarily supplied by the posterior communicating artery with a small P1 segment noted. Pcomm: The posterior communicating arteries are visualized bilaterally. SCAs: The superior cerebellar arteries are patent bilaterally. Basilar artery: Patent AICAs: Patent PICAs: Patent Vertebral arteries: As above. Venous sinuses: As permitted by contrast timing, patent. Anatomic  variants: None Review of the MIP images confirms the above findings IMPRESSION: New vasogenic edema in the left temporal and parietal lobes with extension into lateral aspect of the left occipital lobe. Recommend MRI head with without contrast for further evaluation of possible underlying mass lesion. Associated mass effect and 7 mm rightward midline shift. Additional edema in the left occipital lobe at the site of prior hemorrhage. No evidence of acute hemorrhage. Recommend attention on MRI. No large vessel occlusion. No high-grade stenosis, aneurysm, or dissection of the arteries in the head and neck. Aortic Atherosclerosis (ICD10-I70.0). Electronically Signed: By: Denny Flack M.D. On: 01/14/2024 16:59       Feliciana Horn M.D. [facility] 01/15/2024, 8:53 AM  Available via Epic secure chat 7am-7pm After 7 pm, please refer to night coverage provider listed on amion.

## 2024-01-15 NOTE — Progress Notes (Signed)
 OT Cancellation Note  Patient Details Name: Seth Gonzalez MRN: [id number] DOB: [birth year]   Cancelled Treatment:    Reason Eval/Treat Not Completed: Other (comment) Started OT eval though neurosugery entering for long discussion regarding pt prognosis and options. Unable to complete OT eval - will follow up tomorrow.   Allyse Fregeau  Britt 01/15/2024, 2:10 PM

## 2024-01-16 ENCOUNTER — Other Ambulatory Visit (HOSPITAL_COMMUNITY): Payer: Self-pay

## 2024-01-16 DIAGNOSIS — R900 Intracranial space-occupying lesion found on diagnostic imaging of central nervous system: Secondary | ICD-10-CM | POA: Diagnosis not present

## 2024-01-16 DIAGNOSIS — G9389 Other specified disorders of brain: Secondary | ICD-10-CM | POA: Diagnosis not present

## 2024-01-16 DIAGNOSIS — E871 Hypo-osmolality and hyponatremia: Secondary | ICD-10-CM | POA: Diagnosis not present

## 2024-01-16 DIAGNOSIS — I10 Essential (primary) hypertension: Secondary | ICD-10-CM | POA: Diagnosis not present

## 2024-01-16 MED ORDER — ENSURE ENLIVE PO LIQD
237.0000 mL | Freq: Two times a day (BID) | ORAL | 2 refills | Status: DC
  Filled 2024-01-16: qty 14220, 30d supply, fill #0

## 2024-01-16 MED ORDER — SENNOSIDES-DOCUSATE SODIUM 8.6-50 MG PO TABS
2.0000 | ORAL_TABLET | Freq: Two times a day (BID) | ORAL | Status: DC
  Administered 2024-01-16 – 2024-01-17 (×2): 2 via ORAL
  Filled 2024-01-16 (×2): qty 2

## 2024-01-16 MED ORDER — SENNOSIDES-DOCUSATE SODIUM 8.6-50 MG PO TABS
1.0000 | ORAL_TABLET | Freq: Every evening | ORAL | 1 refills | Status: DC | PRN
  Filled 2024-01-16: qty 30, 30d supply, fill #0

## 2024-01-16 MED ORDER — DEXAMETHASONE 6 MG PO TABS
6.0000 mg | ORAL_TABLET | Freq: Two times a day (BID) | ORAL | 0 refills | Status: AC
  Filled 2024-01-16: qty 60, 30d supply, fill #0

## 2024-01-16 MED ORDER — PANTOPRAZOLE SODIUM 40 MG PO TBEC
40.0000 mg | DELAYED_RELEASE_TABLET | Freq: Every day | ORAL | 2 refills | Status: DC
  Filled 2024-01-16: qty 30, 30d supply, fill #0

## 2024-01-16 MED ORDER — POLYETHYLENE GLYCOL 3350 17 G PO PACK
17.0000 g | PACK | Freq: Every day | ORAL | Status: DC
  Administered 2024-01-16 – 2024-01-17 (×2): 17 g via ORAL
  Filled 2024-01-16 (×2): qty 1

## 2024-01-16 NOTE — Plan of Care (Signed)
  Problem: Education: Goal: Knowledge of General Education information will improve Description: Including pain rating scale, medication(s)/side effects and non-pharmacologic comfort measures Outcome: Progressing   Problem: Health Behavior/Discharge Planning: Goal: Ability to manage health-related needs will improve Outcome: Progressing   Problem: Clinical Measurements: Goal: Cardiovascular complication will be avoided Outcome: Progressing   Problem: Activity: Goal: Risk for activity intolerance will decrease Outcome: Progressing   Problem: Nutrition: Goal: Adequate nutrition will be maintained Outcome: Progressing   Problem: Coping: Goal: Level of anxiety will decrease Outcome: Progressing   Problem: Elimination: Goal: Will not experience complications related to bowel motility Outcome: Progressing Goal: Will not experience complications related to urinary retention Outcome: Progressing   Problem: Pain Managment: Goal: General experience of comfort will improve and/or be controlled Outcome: Progressing   Problem: Safety: Goal: Ability to remain free from injury will improve Outcome: Progressing   Problem: Skin Integrity: Goal: Risk for impaired skin integrity will decrease Outcome: Progressing

## 2024-01-16 NOTE — Progress Notes (Signed)
 Speech Language Pathology Treatment: Cognitive-Linquistic  Patient Details Name: Seth Gonzalez MRN: [id number] DOB: [birth year] Today's Date: 01/16/2024 Time: 7829-5621 SLP Time Calculation (min) (ACUTE ONLY): 25 min  Assessment / Plan / Recommendation Clinical Impression  Pt received alert, pleasant, and cooperative. Easily stated name, DOB, and introduced sister, Seth Gonzalez. Both sister and pt noted subjective improvement in aphasia since started steroids. Initiated caregiver education with sister who will be a primary caregiver for pt at d/c. Education provided re: aphasia, domains of language, pt's CLOF, communication strategies to improve both receptive and expressive communication, environmental modifications to improve communication, anticipation of possible continued decline in speech/language, and SLP POC. Hand out provided. Sister verbalized understanding/agreement with education provided and requested SLP to f/u x1 if pt still hospitalized after the weekend. Pt spent majority of time on commode and was left on commode with NT monitoring. SLP to f/u x1 for additional education, as appropriate.   HPI HPI: Seth Gonzalez is a 73 y.o. male with c/o prior stroke, hypertension was admitted in March 2025 for left occipital intraparenchymal hemorrhage with possible mass has been having some difficulty talking and walking last 2 days as noticed by family.  MRI revealed: heterogenous mass of the left occipital lobe with multifocal internal hemorrhage most consistent with high-grade glioma.      SLP Plan  Continue with current plan of care      Recommendations for follow up therapy are one component of a multi-disciplinary discharge planning process, led by the attending physician.  Recommendations may be updated based on patient status, additional functional criteria and insurance authorization.    Recommendations                           Aphasia (R47.01)     Continue with current plan  of care    Seth Gonzalez, M.S., CCC-SLP Speech-Language Pathologist Secure Chat Preferred  O: [phone]  Seth Gonzalez  01/16/2024, 10:19 AM

## 2024-01-16 NOTE — Plan of Care (Signed)

## 2024-01-16 NOTE — TOC Progression Note (Addendum)
 Transition of Care [facility]) - Progression Note    Patient Details  Name: Seth Gonzalez MRN: [id number] Date of Birth: [birth year]  [facility] CM/SW Contact  Ronni Colace, RN Phone Number: 01/16/2024, 12:02 PM  Clinical Narrative:     Spoke to patient and sister at the bedside introduced self and explained role. Gave them information  choices on hospice from [email or web address], placed copies in shadow charrt. Awaiting Palliative consult. Briefly explained Hospice vs Palliative, vs  residential . Will follow back up for choice after assessed by Palliative 1630 Palliative unable to see today, discussed choice with patient and sister, they chose [facility]. Cherie notified of consult, plan to DC tomorrow. Do not think they will need any equipment, however will follow up with [facility] tomorrow. The patients sister will be caring for him and can transport him home. He will go to his sisters house  [address] [place], [place] Plan  Home Hospice    Expected Discharge Plan and Services                                               Social Determinants of Health (SDOH) Interventions SDOH Screenings   Food Insecurity: No Food Insecurity (01/15/2024)  Housing: Low Risk  (01/15/2024)  Transportation Needs: No Transportation Needs (01/15/2024)  Utilities: Not At Risk (01/15/2024)  Recent Concern: Utilities - At Risk (11/13/2023)  Social Connections: Moderately Isolated (01/15/2024)  Tobacco Use: Low Risk  (01/14/2024)    Readmission Risk Interventions    11/14/2023    1:52 PM  Readmission Risk Prevention Plan  Post Dischage Appt Complete  Medication Screening Complete  Transportation Screening Complete

## 2024-01-16 NOTE — Evaluation (Signed)
 Occupational Therapy Evaluation Patient Details Name: Seth Gonzalez MRN: [id number] DOB: 1950-10-06 Today's Date: 01/16/2024   History of Present Illness   Seth Gonzalez is a 73 y.o. male who has been having some difficulty talking last 2 days as noticed by family. 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  PMH c/o prior stroke, hypertension was admitted in March 2025 for left occipital intraparenchymal hemorrhage with possible mass.     Clinical Impressions Prior to this admission, patient living alone, however given recent prognosis will be moving in with his sister and brother in law. Patient with R visual field deficit, this is known to patient, and he has strategies where he turns his head or closes his R eye when his vision changes to double (often with fatigue). Patient now on steroids, and has resolved his R sided weakness noted in PT evaluation. Patient able to complete ADLs and mobility with CGA. Patient is not interested in further OT services, as he wants to live and enjoy the time he has left to the best of his abilities. OT is respectfully signing off at this time.      If plan is discharge home, recommend the following:   A little help with bathing/dressing/bathroom;Assist for transportation;Supervision due to cognitive status     Functional Status Assessment   Patient has had a recent decline in their functional status and demonstrates the ability to make significant improvements in function in a reasonable and predictable amount of time.     Equipment Recommendations   None recommended by OT (patient has all equipment needed)     Recommendations for Other Services         Precautions/Restrictions   Precautions Precautions: Fall Recall of Precautions/Restrictions: Impaired Restrictions Weight Bearing Restrictions Per Provider Order: No     Mobility Bed Mobility               General  bed mobility comments: in recliner on entry    Transfers Overall transfer level: Needs assistance Equipment used: None Transfers: Sit to/from Stand Sit to Stand: Contact guard assist           General transfer comment: CGA for safety, no scissored gait noted, able to ambulate with no LOB      Balance Overall balance assessment: Mild deficits observed, not formally tested                                         ADL either performed or assessed with clinical judgement   ADL Overall ADL's : Needs assistance/impaired Eating/Feeding: Set up;Sitting   Grooming: Set up;Sitting   Upper Body Bathing: Set up;Sitting   Lower Body Bathing: Set up;Sitting/lateral leans;Sit to/from stand   Upper Body Dressing : Set up;Sitting   Lower Body Dressing: Set up;Sitting/lateral leans;Sit to/from stand   Toilet Transfer: Contact guard assist;Ambulation   Toileting- Clothing Manipulation and Hygiene: Set up;Sitting/lateral lean;Sit to/from stand       Functional mobility during ADLs: Contact guard assist General ADL Comments: Prior to this admission, patient living alone, however given recent prognosis will be moving in with his sister and brother in law. Patient with R visual field deficit, this is known to patient, and he has strategies where he turns his head or closes his R eye when his vision changes to double (often with fatigue). Patient  now on steroids, and has resolved his R sided weakness noted in PT evaluation. Patient able to complete ADLs and mobility with CGA. Patient is not interested in further OT services, as he wants to live and enjoy the time he has left to the best of his abilities. OT is respectfully signing off at this time.     Vision Baseline Vision/History: 1 Wears glasses Ability to See in Adequate Light: 1 Impaired Patient Visual Report: Peripheral vision impairment Vision Assessment?: Wears glasses for reading;Yes Eye Alignment: Within  Functional Limits Ocular Range of Motion: Within Functional Limits Alignment/Gaze Preference: Within Defined Limits Visual Fields: Right visual field deficit Diplopia Assessment: Disappears with one eye closed;Objects split on top of one another;Present all the time/all directions Depth Perception: Overshoots Additional Comments: Patient with R visual field deficit, this is known to patient, and he has strategies where he turns his head or closes his R eye when his vision changes to double. Often with fatigue.     Perception Perception: Impaired Preception Impairment Details: Spatial orientation     Praxis Praxis: WFL       Pertinent Vitals/Pain       Extremity/Trunk Assessment Upper Extremity Assessment Upper Extremity Assessment: Overall WFL for tasks assessed;Right hand dominant       Cervical / Trunk Assessment Cervical / Trunk Assessment: Kyphotic   Communication Communication Communication: Impaired Factors Affecting Communication: Difficulty expressing self   Cognition Arousal: Alert Behavior During Therapy: WFL for tasks assessed/performed Cognition: Difficult to assess Difficult to assess due to: Impaired communication                             Following commands: Intact       Cueing  General Comments   Cueing Techniques: Verbal cues;Gestural cues;Tactile cues  VSS on RA   Exercises     Shoulder Instructions      Home Living Family/patient expects to be discharged to:: Private residence Living Arrangements: Alone Available Help at Discharge: Family;Available 24 hours/day Type of Home: House Home Access: Stairs to enter [employer] of Steps: 3 Entrance Stairs-Rails: Right Home Layout: One level     Bathroom Shower/Tub: Producer, television/film/video: Handicapped height Bathroom Accessibility: No   Home Equipment: Cane - single point;Shower seat;BSC/3in1;Rolling Environmental consultant (2 wheels);Hand held shower head    Additional Comments: now planning on living with his sister and brother in law      Prior Functioning/Environment Prior Level of Function : Independent/Modified Independent;Driving             Mobility Comments: Sister states that recently pt has been using SPC the past couple of days ADLs Comments: independent with ADLs,    OT Problem List: Decreased strength;Decreased activity tolerance;Impaired balance (sitting and/or standing);Impaired vision/perception   OT Treatment/Interventions:        OT Goals(Current goals can be found in the care plan section)   Acute Rehab OT Goals Patient Stated Goal: to live my life well OT Goal Formulation: With patient/family Time For Goal Achievement: 01/30/24 Potential to Achieve Goals: Good   OT Frequency:       Co-evaluation              AM-PAC OT "6 Clicks" Daily Activity     Outcome Measure Help from another person eating meals?: A Little Help from another person taking care of personal grooming?: A Little Help from another person toileting, which includes using toliet, bedpan, or  urinal?: A Little Help from another person bathing (including washing, rinsing, drying)?: A Little Help from another person to put on and taking off regular upper body clothing?: A Little Help from another person to put on and taking off regular lower body clothing?: A Little 6 Click Score: 18   End of Session Equipment Utilized During Treatment: Gait belt Nurse Communication: Mobility status  Activity Tolerance: Patient tolerated treatment well Patient left: in chair;with call bell/phone within reach;with chair alarm set;with family/visitor present  OT Visit Diagnosis: Muscle weakness (generalized) (M62.81);Low vision, both eyes (H54.2);Cognitive communication deficit (R41.841);Other abnormalities of gait and mobility (R26.89)                Time: 1191-4782 OT Time Calculation (min): 28 min Charges:  OT General Charges $OT Visit: 1  Visit OT Evaluation $OT Eval Moderate Complexity: 1 Mod OT Treatments $Self Care/Home Management : 8-22 mins  Mollie Anger E. Corrin Sieling, OTR/L Acute Rehabilitation Services [phone]   Vincent Greek 01/16/2024, 3:49 PM

## 2024-01-16 NOTE — Progress Notes (Signed)
 [facility]                                                                               Seth Gonzalez, is a 73 y.o. male, DOB - 09-23-1950, ZOX:[id number] Admit date - 01/14/2024    Outpatient Primary MD for the patient is Tisovec, Kristina Pfeiffer, MD  LOS - 1  days    Brief summary   Seth Gonzalez is a 73 y.o. male with c/o prior stroke, hypertension was admitted in March 2025 for left occipital intraparenchymal hemorrhage with possible mass has been having some difficulty talking last 2 days as noticed by family.  Patient was finding it difficult to bring out words.  Patient states he is also has been having some difficulty walking with unstable gait.   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 ER physician discussed with on-call neurosurgeon Dr. Larrie Po who advised patient to be started on IV steroids and Dr. Cabbell will be seeing patient in consult.      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 Possible transition to oral decadron on discharge.  NS on board, discussed with patient and sister, . He does not wish for any surgery or chemo or radiation.  He wanted home hospice.  No headache or dizziness.  He appears to be back to baseline.     Hypertension Well controlled.  Resume norvasc  7.5 mg daily. And irbesartan  300 mg daily.     Hyperlipidemia Resume crestor 20 mg daily.    GERD Stable.    H/o intraparenchymal hemorrhage/ stroke Continue with statin.    Mild hyponatremia From [facility]?  TSH wnl. Urine sodium is 48.  Am cortisol is low.   Mild leukocytosis Monitor.  UA IS NEGATIVE.    Estimated body mass index is 22.5 kg/m as calculated from the following:   Height as of this encounter: 6\' 3"  (1.905 m).   Weight as of this encounter: 81.6 kg.  Code Status: full code.  DVT Prophylaxis:  Place and maintain  sequential compression device Start: 01/15/24 1337 SCD's Start: 01/14/24 2206   Level of Care: Level of care: Telemetry Medical Family Communication: Updated patient's family at bedside.  Disposition Plan:     Remains inpatient appropriate:  pending NS recommendations.   Procedures:  None   Consultants:   Neurosurgery Neurology.   Antimicrobials:   Anti-infectives (From admission, onward)    None        Medications  Scheduled Meds:  amLODipine   7.5 mg Oral QHS   dexamethasone (DECADRON) injection  6 mg Intravenous Q6H   feeding supplement  237 mL Oral BID BM   irbesartan   300 mg Oral Daily   pantoprazole   40 mg Oral Daily   rosuvastatin  20 mg Oral Daily   Continuous Infusions: PRN Meds:.acetaminophen  **OR** acetaminophen  (TYLENOL ) oral liquid 160 mg/5 mL **OR** acetaminophen , senna-docusate    Subjective:   Seth Gonzalez was seen and examined today. No new complaints.   Objective:   Vitals:   01/15/24 8119 01/16/24 0407 01/16/24 0846 01/16/24 1230  BP: 137/70 132/69 131/75 122/75  Pulse: 84 81 90 90  Resp: 18 18 20 14   Temp: 97.7 F (36.5 C) 98.3 F (36.8 C) 97.6 F (36.4 C) 98.2 F (36.8 C)  TempSrc: Oral Oral Oral Oral  SpO2: 93% 94% 95% 98%  Weight:      Height:        Intake/Output Summary (Last 24 hours) at 01/16/2024 1353 Last data filed at 01/16/2024 1245 Gross per 24 hour  Intake 320 ml  Output --  Net 320 ml   Filed Weights   01/15/24 1500  Weight: 81.6 kg     Exam General exam: Appears calm and comfortable  Respiratory system: Clear to auscultation. Respiratory effort normal. Cardiovascular system: S1 & S2 heard, RRR. No JVD, Gastrointestinal system: Abdomen is nondistended, soft and nontender. Central nervous system: Alert and oriented.  Extremities: Symmetric 5 x 5 power. Skin: No rashes,  Psychiatry:  Mood & affect appropriate.     Data Reviewed:  I have personally reviewed following labs and imaging studies   CBC Lab  Results  Component Value Date   WBC 10.9 (H) 01/15/2024   RBC 4.38 01/15/2024   RBC 4.31 01/15/2024   HGB 13.3 01/15/2024   HCT 38.5 (L) 01/15/2024   MCV 87.9 01/15/2024   MCH 30.4 01/15/2024   PLT 310 01/15/2024   MCHC 34.5 01/15/2024   RDW 11.9 01/15/2024   LYMPHSABS 1.4 01/14/2024   MONOABS 0.6 01/14/2024   EOSABS 0.1 01/14/2024   BASOSABS 0.1 01/14/2024     Last metabolic panel Lab Results  Component Value Date   NA 131 (L) 01/15/2024   K 3.8 01/15/2024   CL 102 01/15/2024   CO2 20 (L) 01/15/2024   BUN 15 01/15/2024   CREATININE 0.75 01/15/2024   GLUCOSE 147 (H) 01/15/2024   GFRNONAA >60 01/15/2024   CALCIUM 9.0 01/15/2024   PROT 6.6 01/15/2024   ALBUMIN 3.5 01/15/2024   BILITOT 0.8 01/15/2024   ALKPHOS 72 01/15/2024   AST 19 01/15/2024   ALT 17 01/15/2024   ANIONGAP 9 01/15/2024    CBG (last 3)  Recent Labs    01/14/24 1452  GLUCAP 99      Coagulation Profile: Recent Labs  Lab 01/14/24 1640  INR 1.1     Radiology Studies: MR BRAIN W WO CONTRAST Result Date: 01/14/2024 CLINICAL DATA:  Acute neurologic deficit EXAM: MRI HEAD WITHOUT AND WITH CONTRAST TECHNIQUE: Multiplanar, multiecho pulse sequences of the brain and surrounding structures were obtained without and with intravenous contrast. CONTRAST:  8mL GADAVIST  GADOBUTROL  1 MMOL/ML IV SOLN COMPARISON:  11/12/2023 brain MRI FINDINGS: Brain: There is heterogeneous mass of the left occipital lobe with multifocal internal hemorrhage. There is a large amount of surrounding hyperintense T2-weighted signal. There is mass effect from the medially bulging left temporal lobe on the left posterior surface of the midbrain. At the level of the foramina Monro, there is 6 mm of rightward midline shift. There is heterogeneous contrast enhancement mass which altogether measures approximately 6 point by 3.0 cm. The areas of enhancement are discontinuous but posterior left hemisphere. No abnormal contrast enhancement  elsewhere in the brain. Masses worsened 3125 Vascular: Normal flow voids. Skull and upper cervical spine: Normal calvarium and skull base. Visualized upper cervical spine and soft tissues are normal. Sinuses/Orbits:No paranasal sinus fluid levels or advanced mucosal thickening. No mastoid or middle ear effusion. Normal orbits. IMPRESSION: 1. Heterogeneous mass of the left occipital lobe with multifocal internal hemorrhage and a  large amount of surrounding hyperintense T2-weighted signal. This is most consistent with a high-grade glioma. 2. Mass effect from the medially bulging left temporal lobe on the left posterior surface of the midbrain. At the level of the Foramina of Monro, there is 6 mm of rightward midline shift. Electronically Signed   By: Juanetta Nordmann M.D.   On: 01/14/2024 19:59   CT ANGIO HEAD NECK W WO CM Addendum Date: 01/14/2024 ADDENDUM REPORT: 01/14/2024 17:30 ADDENDUM: These results were called by telephone at the time of interpretation on 01/14/2024 at 5:02 pm to provider Dr. Leida Puna, who verbally acknowledged these results. Electronically Signed   By: Denny Flack M.D.   On: 01/14/2024 17:30   Result Date: 01/14/2024 CLINICAL DATA:  Neuro deficit, concern for stroke, speech difficulty. EXAM: CT ANGIOGRAPHY HEAD AND NECK WITH AND WITHOUT CONTRAST TECHNIQUE: Multidetector CT imaging of the head and neck was performed using the standard protocol during bolus administration of intravenous contrast. Multiplanar CT image reconstructions and MIPs were obtained to evaluate the vascular anatomy. Carotid stenosis measurements (when applicable) are obtained utilizing NASCET criteria, using the distal internal carotid diameter as the denominator. RADIATION DOSE REDUCTION: This exam was performed according to the departmental dose-optimization program which includes automated exposure control, adjustment of the mA and/or kV according to patient size and/or use of iterative reconstruction technique.  CONTRAST:  75mL OMNIPAQUE  IOHEXOL  350 MG/ML SOLN COMPARISON:  CT head 11/13/2023, CTA head 11/12/2023. FINDINGS: CT HEAD FINDINGS Brain: There is new significant vasogenic edema within the left temporal and left parietal lobes with partial extension into the lateral aspect of the left occipital lobe resulting in local mass effect and sulcal effacement throughout the left cerebral hemisphere. There is associated 7 mm rightward midline shift. Additional edema in the left occipital lobe near the site of prior hemorrhage without evidence of new hemorrhage. The basilar cisterns are patent. Posterior fossa without acute abnormality. Ventricles: Partial effacement of the left lateral ventricle. There is significant effacement of the left temporal horn and atrium. No hydrocephalus. Vascular: No hyperdense vessel. Skull: No acute or aggressive finding. Sinuses/orbits: The visualized paranasal sinuses are clear. Orbits are symmetric. Other: Mastoid air cells are clear. CTA NECK FINDINGS Aortic arch: Standard configuration of the aortic arch. Imaged portion shows no evidence of aneurysm or dissection. Mild atherosclerosis of the aortic arch. No significant stenosis of the major arch vessel origins. Pulmonary arteries: As permitted by contrast timing, there are no filling defects in the visualized pulmonary arteries. Subclavian arteries: The subclavian arteries are patent bilaterally. Right carotid system: No evidence of dissection, stenosis (50% or greater), or occlusion. Left carotid system: No evidence of dissection, stenosis (50% or greater), or occlusion. Vertebral arteries: Right vertebral artery is dominant. The right vertebral artery is patent from the origin to the vertebrobasilar confluence. Non dominant left vertebral artery is patent from the origin to the intracranial segment and terminates at the origin of the left PICA. No evidence of dissection, stenosis (50% or greater), or occlusion. Skeleton: No acute or  aggressive finding noted. Other neck: The visualized airway is patent. No cervical lymphadenopathy. Upper chest: Visualized lung apices are clear. Review of the MIP images confirms the above findings CTA HEAD FINDINGS ANTERIOR CIRCULATION: The intracranial ICAs are patent bilaterally. No significant stenosis, proximal occlusion, aneurysm, or vascular malformation. MCAs: The middle cerebral arteries are patent bilaterally. ACAs: The anterior cerebral arteries are patent bilaterally. POSTERIOR CIRCULATION: No significant stenosis, proximal occlusion, aneurysm, or vascular malformation. PCAs: Patent bilaterally. Fetal origin  of the left PCA. The right PCA is primarily supplied by the posterior communicating artery with a small P1 segment noted. Pcomm: The posterior communicating arteries are visualized bilaterally. SCAs: The superior cerebellar arteries are patent bilaterally. Basilar artery: Patent AICAs: Patent PICAs: Patent Vertebral arteries: As above. Venous sinuses: As permitted by contrast timing, patent. Anatomic variants: None Review of the MIP images confirms the above findings IMPRESSION: New vasogenic edema in the left temporal and parietal lobes with extension into lateral aspect of the left occipital lobe. Recommend MRI head with without contrast for further evaluation of possible underlying mass lesion. Associated mass effect and 7 mm rightward midline shift. Additional edema in the left occipital lobe at the site of prior hemorrhage. No evidence of acute hemorrhage. Recommend attention on MRI. No large vessel occlusion. No high-grade stenosis, aneurysm, or dissection of the arteries in the head and neck. Aortic Atherosclerosis (ICD10-I70.0). Electronically Signed: By: Denny Flack M.D. On: 01/14/2024 16:59       Feliciana Horn M.D. [facility] 01/16/2024, 1:53 PM  Available via Epic secure chat 7am-7pm After 7 pm, please refer to night coverage provider listed on amion.

## 2024-01-17 ENCOUNTER — Other Ambulatory Visit (HOSPITAL_COMMUNITY): Payer: Self-pay

## 2024-01-17 DIAGNOSIS — I10 Essential (primary) hypertension: Secondary | ICD-10-CM | POA: Diagnosis not present

## 2024-01-17 DIAGNOSIS — G9389 Other specified disorders of brain: Secondary | ICD-10-CM | POA: Diagnosis not present

## 2024-01-17 DIAGNOSIS — R900 Intracranial space-occupying lesion found on diagnostic imaging of central nervous system: Secondary | ICD-10-CM | POA: Diagnosis not present

## 2024-01-17 MED ORDER — POLYETHYLENE GLYCOL 3350 17 GM/SCOOP PO POWD
17.0000 g | Freq: Every day | ORAL | 0 refills | Status: DC | PRN
  Filled 2024-01-17: qty 238, 14d supply, fill #0

## 2024-01-17 MED ORDER — SENNOSIDES-DOCUSATE SODIUM 8.6-50 MG PO TABS: 2.0000 | ORAL_TABLET | Freq: Every evening | ORAL | Status: DC | PRN

## 2024-01-17 NOTE — Discharge Summary (Signed)
 Physician Discharge Summary   Patient: Seth Gonzalez MRN: [id number] DOB: August 24, 1951  Admit date:     01/14/2024  Discharge date:   Discharge Physician: Feliciana Horn   PCP: Tisovec, Richard W, MD   Recommendations at discharge:  Please follow up with hospice MD as recommended.   Discharge Diagnoses: Principal Problem:   Intracranial mass Active Problems:   Essential hypertension   Anemia   Brain mass    Hospital Course: Seth Gonzalez is a 73 y.o. male with c/o prior stroke, hypertension was admitted in March 2025 for left occipital intraparenchymal hemorrhage with possible mass has been having some difficulty talking last 2 days as noticed by family.  Patient was finding it difficult to bring out words.  Patient states he is also has been having some difficulty walking with unstable gait.    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 ER physician discussed with on-call neurosurgeon Dr. Larrie Po who advised patient to be started on IV steroids and Dr. Cabbell will be seeing patient in consult.       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 Possible transition to oral decadron  on discharge.  NS on board, discussed with patient and sister, . He does not wish for any surgery or chemo or radiation.  He wanted home hospice.  No headache or dizziness.  He appears to be back to baseline.        Hypertension Well controlled.  Resume norvasc  7.5 mg daily. And irbesartan  300 mg daily.        Hyperlipidemia Resume crestor  20 mg daily.      GERD Stable.      H/o intraparenchymal hemorrhage/ stroke Continue with statin.      Mild hyponatremia From [facility]?  TSH wnl. Urine sodium is 48.  Am cortisol is low.    Mild leukocytosis Monitor.  UA IS NEGATIVE.      Estimated body mass index is 22.5 kg/m as calculated from the following:   Height as of this  encounter: 6\' 3"  (1.905 m).   Weight as of this encounter: 81.6 kg.     Consultants: neurosurgery.  Procedures performed: none.   Disposition: Home Diet recommendation:  Discharge Diet Orders (From admission, onward)     Start     Ordered   01/17/24 0000  Diet - low sodium heart healthy        01/17/24 1535           Regular diet DISCHARGE MEDICATION: Allergies as of 01/17/2024   No Known Allergies      Medication List     TAKE these medications    amLODipine  5 MG tablet Commonly known as: NORVASC  Take 7.5 mg by mouth at bedtime.   dexamethasone  6 MG tablet Commonly known as: DECADRON  Take 1 tablet (6 mg total) by mouth 2 (two) times daily with a meal.   feeding supplement Liqd Take 237 mLs by mouth 2 (two) times daily between meals.   multivitamin tablet Take 1 tablet by mouth 3 (three) times a week.   pantoprazole  40 MG tablet Commonly known as: PROTONIX  Take 1 tablet (40 mg total) by mouth daily.   polyethylene glycol 17 g packet Commonly known as: MIRALAX  / GLYCOLAX  Take 17 g by mouth daily as needed.   rosuvastatin  20 MG tablet Commonly known as: CRESTOR  Take 20 mg by mouth daily.   senna-docusate 8.6-50 MG  tablet Commonly known as: Senokot-S Take 1 tablet by mouth at bedtime as needed for mild constipation.   senna-docusate 8.6-50 MG tablet Commonly known as: Senokot-S Take 2 tablets by mouth at bedtime as needed for mild constipation.   telmisartan 80 MG tablet Commonly known as: MICARDIS Take 80 mg by mouth daily.               Durable Medical Equipment  (From admission, onward)           Start     Ordered   01/15/24 1310  For home use only DME Walker rolling  Once       Question Answer Comment  Walker: With 5 Inch Wheels   Patient needs a walker to treat with the following condition Weakness      01/15/24 1309            Follow-up Information     Vaslow, Zachary K, MD. Schedule an appointment as soon as  possible for a visit in 1 week(s).   Specialties: Psychiatry, Neurology, Oncology Contact information: [address] [place] [address] [phone]         [facility] Follow up.   Contact information: [address] [place] [place]  [address] [phone]               Discharge Exam: Filed Weights   01/15/24 1500  Weight: 81.6 kg   General exam: Appears calm and comfortable  Respiratory system: Clear to auscultation. Respiratory effort normal. Cardiovascular system: S1 & S2 heard, RRR Gastrointestinal system: Abdomen is nondistended, soft and nontender.  Central nervous system: Alert and oriented.  Extremities: Symmetric 5 x 5 power. Skin: No rashes, Psychiatry: Mood & affect appropriate.    Condition at discharge: fair  The results of significant diagnostics from this hospitalization (including imaging, microbiology, ancillary and laboratory) are listed below for reference.   Imaging Studies: MR BRAIN W WO CONTRAST Result Date: 01/14/2024 CLINICAL DATA:  Acute neurologic deficit EXAM: MRI HEAD WITHOUT AND WITH CONTRAST TECHNIQUE: Multiplanar, multiecho pulse sequences of the brain and surrounding structures were obtained without and with intravenous contrast. CONTRAST:  8mL GADAVIST  GADOBUTROL  1 MMOL/ML IV SOLN COMPARISON:  11/12/2023 brain MRI FINDINGS: Brain: There is heterogeneous mass of the left occipital lobe with multifocal internal hemorrhage. There is a large amount of surrounding hyperintense T2-weighted signal. There is mass effect from the medially bulging left temporal lobe on the left posterior surface of the midbrain. At the level of the foramina Monro, there is 6 mm of rightward midline shift. There is heterogeneous contrast enhancement mass which altogether measures approximately 6 point by 3.0 cm. The areas of enhancement are discontinuous but posterior left hemisphere. No abnormal contrast enhancement elsewhere in  the brain. Masses worsened 3125 Vascular: Normal flow voids. Skull and upper cervical spine: Normal calvarium and skull base. Visualized upper cervical spine and soft tissues are normal. Sinuses/Orbits:No paranasal sinus fluid levels or advanced mucosal thickening. No mastoid or middle ear effusion. Normal orbits. IMPRESSION: 1. Heterogeneous mass of the left occipital lobe with multifocal internal hemorrhage and a large amount of surrounding hyperintense T2-weighted signal. This is most consistent with a high-grade glioma. 2. Mass effect from the medially bulging left temporal lobe on the left posterior surface of the midbrain. At the level of the Foramina of Monro, there is 6 mm of rightward midline shift. Electronically Signed   By: Juanetta Nordmann M.D.   On: 01/14/2024 19:59   CT  ANGIO HEAD NECK W WO CM Addendum Date: 01/14/2024 ADDENDUM REPORT: 01/14/2024 17:30 ADDENDUM: These results were called by telephone at the time of interpretation on 01/14/2024 at 5:02 pm to provider Dr. Leida Puna, who verbally acknowledged these results. Electronically Signed   By: Denny Flack M.D.   On: 01/14/2024 17:30   Result Date: 01/14/2024 CLINICAL DATA:  Neuro deficit, concern for stroke, speech difficulty. EXAM: CT ANGIOGRAPHY HEAD AND NECK WITH AND WITHOUT CONTRAST TECHNIQUE: Multidetector CT imaging of the head and neck was performed using the standard protocol during bolus administration of intravenous contrast. Multiplanar CT image reconstructions and MIPs were obtained to evaluate the vascular anatomy. Carotid stenosis measurements (when applicable) are obtained utilizing NASCET criteria, using the distal internal carotid diameter as the denominator. RADIATION DOSE REDUCTION: This exam was performed according to the departmental dose-optimization program which includes automated exposure control, adjustment of the mA and/or kV according to patient size and/or use of iterative reconstruction technique. CONTRAST:  75mL  OMNIPAQUE  IOHEXOL  350 MG/ML SOLN COMPARISON:  CT head 11/13/2023, CTA head 11/12/2023. FINDINGS: CT HEAD FINDINGS Brain: There is new significant vasogenic edema within the left temporal and left parietal lobes with partial extension into the lateral aspect of the left occipital lobe resulting in local mass effect and sulcal effacement throughout the left cerebral hemisphere. There is associated 7 mm rightward midline shift. Additional edema in the left occipital lobe near the site of prior hemorrhage without evidence of new hemorrhage. The basilar cisterns are patent. Posterior fossa without acute abnormality. Ventricles: Partial effacement of the left lateral ventricle. There is significant effacement of the left temporal horn and atrium. No hydrocephalus. Vascular: No hyperdense vessel. Skull: No acute or aggressive finding. Sinuses/orbits: The visualized paranasal sinuses are clear. Orbits are symmetric. Other: Mastoid air cells are clear. CTA NECK FINDINGS Aortic arch: Standard configuration of the aortic arch. Imaged portion shows no evidence of aneurysm or dissection. Mild atherosclerosis of the aortic arch. No significant stenosis of the major arch vessel origins. Pulmonary arteries: As permitted by contrast timing, there are no filling defects in the visualized pulmonary arteries. Subclavian arteries: The subclavian arteries are patent bilaterally. Right carotid system: No evidence of dissection, stenosis (50% or greater), or occlusion. Left carotid system: No evidence of dissection, stenosis (50% or greater), or occlusion. Vertebral arteries: Right vertebral artery is dominant. The right vertebral artery is patent from the origin to the vertebrobasilar confluence. Non dominant left vertebral artery is patent from the origin to the intracranial segment and terminates at the origin of the left PICA. No evidence of dissection, stenosis (50% or greater), or occlusion. Skeleton: No acute or aggressive finding  noted. Other neck: The visualized airway is patent. No cervical lymphadenopathy. Upper chest: Visualized lung apices are clear. Review of the MIP images confirms the above findings CTA HEAD FINDINGS ANTERIOR CIRCULATION: The intracranial ICAs are patent bilaterally. No significant stenosis, proximal occlusion, aneurysm, or vascular malformation. MCAs: The middle cerebral arteries are patent bilaterally. ACAs: The anterior cerebral arteries are patent bilaterally. POSTERIOR CIRCULATION: No significant stenosis, proximal occlusion, aneurysm, or vascular malformation. PCAs: Patent bilaterally. Fetal origin of the left PCA. The right PCA is primarily supplied by the posterior communicating artery with a small P1 segment noted. Pcomm: The posterior communicating arteries are visualized bilaterally. SCAs: The superior cerebellar arteries are patent bilaterally. Basilar artery: Patent AICAs: Patent PICAs: Patent Vertebral arteries: As above. Venous sinuses: As permitted by contrast timing, patent. Anatomic variants: None Review of the MIP images confirms the  above findings IMPRESSION: New vasogenic edema in the left temporal and parietal lobes with extension into lateral aspect of the left occipital lobe. Recommend MRI head with without contrast for further evaluation of possible underlying mass lesion. Associated mass effect and 7 mm rightward midline shift. Additional edema in the left occipital lobe at the site of prior hemorrhage. No evidence of acute hemorrhage. Recommend attention on MRI. No large vessel occlusion. No high-grade stenosis, aneurysm, or dissection of the arteries in the head and neck. Aortic Atherosclerosis (ICD10-I70.0). Electronically Signed: By: Denny Flack M.D. On: 01/14/2024 16:59   Cardiac event monitor Result Date: 12/26/2023 The patient wore the monitor for 28 days starting November 21, 2023 Indication: Stroke The minimum heart rate was 40 bpm, maximum heart rate was 125 bpm, and average  heart rate was 72 bpm. Predominant underlying rhythm was Sinus Rhythm ( with 1 episode of first-degree AV block heart rate 78 bpm). Premature atrial complexes were rare. Premature Ventricular complexes were rare. No pauses, No AV block and no atrial fibrillation present. Patient triggered events associated with sinus rhythm. Conclusion: Normal/unremarkable study.    Microbiology: Results for orders placed or performed during the hospital encounter of 11/12/23  MRSA Next Gen by PCR, Nasal     Status: None   Collection Time: 11/13/23 10:00 AM   Specimen: Nasal Mucosa; Nasal Swab  Result Value Ref Range Status   MRSA by PCR Next Gen NOT DETECTED NOT DETECTED Final    Comment: (NOTE) The GeneXpert MRSA Assay (FDA approved for NASAL specimens only), is one component of a comprehensive MRSA colonization surveillance program. It is not intended to diagnose MRSA infection nor to guide or monitor treatment for MRSA infections. Test performance is not FDA approved in patients less than 59 years old. Performed at [facility], [address], [place], [place] [address]     Labs: CBC: Recent Labs  Lab 01/14/24 1438 01/14/24 1640 01/14/24 1654 01/15/24 0643  WBC 7.9 8.0  --  10.9*  NEUTROABS  --  5.8  --   --   HGB 12.5* 12.5* 12.6* 13.3  HCT 37.4* 37.4* 37.0* 38.5*  MCV 91.0 91.2  --  87.9  PLT 292 282  --  310   Basic Metabolic Panel: Recent Labs  Lab 01/14/24 1438 01/14/24 1654 01/15/24 0643  NA 131* 132* 131*  K 3.9 4.0 3.8  CL 100 98 102  CO2 20*  --  20*  GLUCOSE 107* 93 147*  BUN 15 16 15   CREATININE 0.82 0.90 0.75  CALCIUM  8.7*  --  9.0   Liver Function Tests: Recent Labs  Lab 01/14/24 1438 01/15/24 0643  AST 22 19  ALT 21 17  ALKPHOS 67 72  BILITOT 0.4 0.8  PROT 6.4* 6.6  ALBUMIN 3.8 3.5   CBG: Recent Labs  Lab 01/14/24 1452  GLUCAP 99    Discharge time spent: 36 minutes.   Signed: Feliciana Horn, MD [facility] 01/17/2024

## 2024-01-17 NOTE — Care Management (Addendum)
 Spoke w [facility], they are needing to speak w patient's sister to confirm GOC for hospice care and pending that then they will seek approval for hospice services. Anticipate auth for home hospice by 3pm today.  MD notified.    [facility] spoke w sister and reports that they are in agreement for hospice GOC. HoP states they could see him at home on Monday, they do not have any openings for a start of care workup until Monday. Hospice states this was discussed with the patient's sister and she is agreeable to home today with [facility] for Hospice Monday. She has the number for the hospice nurse if an emergency arises.   I confirmed w the patient's sister at bedside that he is able to transport by car, and that he has a RW and BSC at home. There are no emergent DME needs, patient is RA.   MD updated of above.

## 2024-01-17 NOTE — Plan of Care (Signed)
 Had been calm, cooperative, still confused. Assisted pt to bathroom, no complaints noted.  Problem: Education: Goal: Knowledge of General Education information will improve Description: Including pain rating scale, medication(s)/side effects and non-pharmacologic comfort measures Outcome: Progressing   Problem: Health Behavior/Discharge Planning: Goal: Ability to manage health-related needs will improve Outcome: Progressing   Problem: Clinical Measurements: Goal: Will remain free from infection Outcome: Progressing   Problem: Nutrition: Goal: Adequate nutrition will be maintained Outcome: Progressing   Problem: Coping: Goal: Level of anxiety will decrease Outcome: Progressing   Problem: Elimination: Goal: Will not experience complications related to bowel motility Outcome: Progressing   Problem: Safety: Goal: Ability to remain free from injury will improve Outcome: Progressing   Problem: Skin Integrity: Goal: Risk for impaired skin integrity will decrease Outcome: Progressing

## 2024-01-27 ENCOUNTER — Other Ambulatory Visit

## 2024-02-09 ENCOUNTER — Encounter (HOSPITAL_COMMUNITY): Admitting: Speech Pathology

## 2024-02-10 ENCOUNTER — Ambulatory Visit: Admitting: Cardiology

## 2024-02-13 ENCOUNTER — Inpatient Hospital Stay

## 2024-02-19 ENCOUNTER — Inpatient Hospital Stay: Attending: Internal Medicine | Admitting: Internal Medicine

## 2024-02-19 VITALS — BP 131/66 | HR 89 | Temp 97.5°F | Resp 20 | Wt 173.2 lb

## 2024-02-19 DIAGNOSIS — G9389 Other specified disorders of brain: Secondary | ICD-10-CM

## 2024-02-19 DIAGNOSIS — G939 Disorder of brain, unspecified: Secondary | ICD-10-CM | POA: Diagnosis present

## 2024-02-19 DIAGNOSIS — Z79899 Other long term (current) drug therapy: Secondary | ICD-10-CM | POA: Diagnosis not present

## 2024-02-19 NOTE — Progress Notes (Signed)
 [facility] [address]  [place], [place] [address] [phone]   New Patient Evaluation  Date of Service: 02/19/24 Patient Name: Seth Gonzalez Patient MRN: [id number] Patient DOB: Aug 24, 1951 Provider: Mamie Searles, MD  Identifying Statement:  Seth Gonzalez is a 73 y.o. male with left occipital mass who presents for initial consultation and evaluation.    Referring Provider: Tisovec, Richard W, MD [address] [place],  [place] [address]  Oncologic History: 01/14/24: Presents with left occipital mass  Biomarkers:  MGMT Unknown.  IDH 1/2 Unknown.  EGFR Unknown  TERT Unknown   History of Present Illness: The patient's records from the referring physician were obtained and reviewed and the patient interviewed to confirm this HPI.  Seth Gonzalez presented to neurologic attention in May 2025 with several weeks progressive right sided weakness, visual impairment, confusion.  CNS imaging demonstrated large left occipital mass c/w primary neoplasm.  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  Surgery declined intervention and the patient was ultimately discharged with home hospice.  During hospice stay he has not declined appreciably.  He is sleeping often, not walking at all, but dressing and feeding himself.  No headaches or frank seizures.  Medications: Current Outpatient Medications on File Prior to Visit  Medication Sig Dispense Refill   amLODipine  (NORVASC ) 5 MG tablet Take 7.5 mg by mouth at bedtime.     dexamethasone  (DECADRON ) 6 MG tablet Take 6 mg by mouth 2 (two) times daily.     feeding supplement (ENSURE ENLIVE / ENSURE PLUS) LIQD Take 237 mLs by mouth 2 (two) times daily between meals. 14220 mL 2   LORazepam (ATIVAN) 0.5 MG tablet Take 0.5 mg by mouth every 4 (four) hours.     Multiple Vitamin (MULTIVITAMIN) tablet Take 1 tablet by mouth 3 (three) times a week.     senna-docusate (SENOKOT-S)  8.6-50 MG tablet Take 1 tablet by mouth at bedtime as needed for mild constipation. 30 tablet 1   telmisartan (MICARDIS) 80 MG tablet Take 80 mg by mouth daily.     pantoprazole  (PROTONIX ) 40 MG tablet Take 1 tablet (40 mg total) by mouth daily. 30 tablet 2   polyethylene glycol powder (GLYCOLAX /MIRALAX ) 17 GM/SCOOP powder Mix 17 g (1 capful) with 8 oz of water and take by mouth daily as needed. 238 g 0   rosuvastatin  (CRESTOR ) 20 MG tablet Take 20 mg by mouth daily.     senna-docusate (SENOKOT-S) 8.6-50 MG tablet Take 2 tablets by mouth at bedtime as needed for mild constipation.     No current facility-administered medications on file prior to visit.    Allergies: No Known Allergies Past Medical History:  Past Medical History:  Diagnosis Date   Hearing loss    Hypertension    Past Surgical History:  Past Surgical History:  Procedure Laterality Date   APPENDECTOMY     CATARACT EXTRACTION, BILATERAL     COLONOSCOPY  2009   DENTAL SURGERY     NASAL SEPTUM SURGERY     Social History:  Social History   Socioeconomic History   Marital status: Divorced    Spouse name: Not on file   Number of children: Not on file   Years of education: Not on file   Highest education level: Not on file  Occupational History   Not on file  Tobacco Use   Smoking status: Never   Smokeless tobacco:  Never  Vaping Use   Vaping status: Never Used  Substance and Sexual Activity   Alcohol  use: No   Drug use: No   Sexual activity: Not on file  Other Topics Concern   Not on file  Social History Narrative   Pt lives alone in 2 story home   Divorced   No children   Has associates degree   Works as Comptroller.    Social Drivers of Corporate investment banker Strain: Not on file  Food Insecurity: No Food Insecurity (02/19/2024)   Hunger Vital Sign    Worried About Running Out of Food in the Last Year: Never true    Ran Out of Food in the Last Year: Never true  Transportation Needs: No  Transportation Needs (02/19/2024)   PRAPARE - Administrator, Civil Service (Medical): No    Lack of Transportation (Non-Medical): No  Physical Activity: Not on file  Stress: Not on file  Social Connections: Moderately Isolated (01/15/2024)   Social Connection and Isolation Panel    Frequency of Communication with Friends and Family: Twice a week    Frequency of Social Gatherings with Friends and Family: Once a week    Attends Religious Services: Never    Database administrator or Organizations: No    Attends Engineer, structural: 1 to 4 times per year    Marital Status: Divorced  Catering manager Violence: Not At Risk (02/19/2024)   Humiliation, Afraid, Rape, and Kick questionnaire    Fear of Current or Ex-Partner: No    Emotionally Abused: No    Physically Abused: No    Sexually Abused: No   Family History:  Family History  Problem Relation Age of Onset   Dementia Maternal Aunt    Dementia Paternal Uncle    Colon cancer Neg Hx    Colon polyps Neg Hx    Esophageal cancer Neg Hx    Rectal cancer Neg Hx    Stomach cancer Neg Hx     Review of Systems: Constitutional: Doesn't report fevers, chills or abnormal weight loss Eyes: Doesn't report blurriness of vision Ears, nose, mouth, throat, and face: Doesn't report sore throat Respiratory: Doesn't report cough, dyspnea or wheezes Cardiovascular: Doesn't report palpitation, chest discomfort  Gastrointestinal:  Doesn't report nausea, constipation, diarrhea GU: Doesn't report incontinence Skin: Doesn't report skin rashes Neurological: Per HPI Musculoskeletal: Doesn't report joint pain Behavioral/Psych: Doesn't report anxiety  Physical Exam: Vitals:   02/19/24 1505  BP: 131/66  Pulse: 89  Resp: 20  Temp: (!) 97.5 F (36.4 C)  SpO2: 96%   KPS: 70. General: Alert, cooperative, pleasant, in no acute distress Head: Normal EENT: No conjunctival injection or scleral icterus.  Lungs: Resp effort  normal Cardiac: Regular rate Abdomen: Non-distended abdomen Skin: No rashes cyanosis or petechiae. Extremities: No clubbing or edema  Neurologic Exam: Mental Status: Awake, alert, attentive to examiner. Oriented to self and environment. Language is fluent with intact comprehension.  Cranial Nerves: Visual acuity is grossly normal. Dense R hemianopia Extra-ocular movements intact. No ptosis. Face is symmetric Motor: Tone and bulk are normal. Power 4/5 in right arm and leg. Reflexes are symmetric, no pathologic reflexes present.  Sensory: Intact to light touch Gait: Non ambulatory   Labs: I have reviewed the data as listed    Component Value Date/Time   NA 131 (L) 01/15/2024 0643   K 3.8 01/15/2024 0643   CL 102 01/15/2024 0643   CO2 20 (L)  01/15/2024 0643   GLUCOSE 147 (H) 01/15/2024 0643   BUN 15 01/15/2024 0643   CREATININE 0.75 01/15/2024 0643   CALCIUM  9.0 01/15/2024 0643   PROT 6.6 01/15/2024 0643   ALBUMIN 3.5 01/15/2024 0643   AST 19 01/15/2024 0643   ALT 17 01/15/2024 0643   ALKPHOS 72 01/15/2024 0643   BILITOT 0.8 01/15/2024 0643   GFRNONAA >60 01/15/2024 0643   Lab Results  Component Value Date   WBC 10.9 (H) 01/15/2024   NEUTROABS 5.8 01/14/2024   HGB 13.3 01/15/2024   HCT 38.5 (L) 01/15/2024   MCV 87.9 01/15/2024   PLT 310 01/15/2024    Imaging: CLINICAL DATA:  Acute neurologic deficit   EXAM: MRI HEAD WITHOUT AND WITH CONTRAST   TECHNIQUE: Multiplanar, multiecho pulse sequences of the brain and surrounding structures were obtained without and with intravenous contrast.   CONTRAST:  8mL GADAVIST  GADOBUTROL  1 MMOL/ML IV SOLN   COMPARISON:  11/12/2023 brain MRI   FINDINGS: Brain: There is heterogeneous mass of the left occipital lobe with multifocal internal hemorrhage. There is a large amount of surrounding hyperintense T2-weighted signal. There is mass effect from the medially bulging left temporal lobe on the left posterior surface of the  midbrain. At the level of the foramina Monro, there is 6 mm of rightward midline shift. There is heterogeneous contrast enhancement mass which altogether measures approximately 6 point by 3.0 cm. The areas of enhancement are discontinuous but posterior left hemisphere. No abnormal contrast enhancement elsewhere in the brain. Masses worsened 3125   Vascular: Normal flow voids.   Skull and upper cervical spine: Normal calvarium and skull base. Visualized upper cervical spine and soft tissues are normal.   Sinuses/Orbits:No paranasal sinus fluid levels or advanced mucosal thickening. No mastoid or middle ear effusion. Normal orbits.   IMPRESSION: 1. Heterogeneous mass of the left occipital lobe with multifocal internal hemorrhage and a large amount of surrounding hyperintense T2-weighted signal. This is most consistent with a high-grade glioma. 2. Mass effect from the medially bulging left temporal lobe on the left posterior surface of the midbrain. At the level of the Foramina of Monro, there is 6 mm of rightward midline shift.     Electronically Signed   By: Juanetta Nordmann M.D.   On: 01/14/2024 19:59  Pathology: n/a  Assessment/Plan Brain mass  We appreciate the opportunity to participate in the care of Lahoma Pigg.  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  We had an extensive conversation with him and his family regarding pathology, prognosis, and available treatment pathways.  He has been on home hospice for the past month.  He understands any treatments offered would be life prolonging, not curative.  They would not improve his current neurologic deficits, only hope to maintain his current condition.  We ultimately discussed proceeding with course of intensity modulated radiation therapy without Temozolomide.  Radiation would be administered Mon-Fri over 3 weeks.  He will discuss with his family if he  wants to continue hospice or transition back into medical care for life prolonging treatments.  If he does want to proceed, we will need to obtain an updated MRI brain study.    Screening for potential clinical trials was performed and discussed using eligibility criteria for active protocols at [facility], loco-regional tertiary centers, as well as national database available on [email or web address].    The patient is not a candidate for a research protocol at this time  due to no suitable study identified.   We spent twenty additional minutes teaching regarding the natural history, biology, and historical experience in the treatment of brain tumors. We then discussed in detail the current recommendations for therapy focusing on the mode of administration, mechanism of action, anticipated toxicities, and quality of life issues associated with this plan. We also provided teaching sheets for the patient to take home as an additional resource.  All questions were answered. The patient knows to call the clinic with any problems, questions or concerns. No barriers to learning were detected.  The total time spent in the encounter was 60 minutes and more than 50% was on counseling and review of test results   Mamie Searles, MD Medical Director of Neuro-Oncology [facility] 02/19/24 4:49 PM

## 2024-04-02 DEATH — deceased

## 2024-04-05 ENCOUNTER — Ambulatory Visit: Admitting: Neurology
